# Patient Record
Sex: Male | Born: 1940 | Race: Black or African American | Hispanic: No | Marital: Married | Smoking: Former smoker
Health system: Southern US, Community
[De-identification: ages and names within clinical notes are randomized; demographics above are authoritative.]

## PROBLEM LIST (undated history)

## (undated) DIAGNOSIS — C189 Malignant neoplasm of colon, unspecified: Secondary | ICD-10-CM

## (undated) DIAGNOSIS — Z7952 Long term (current) use of systemic steroids: Secondary | ICD-10-CM

## (undated) DIAGNOSIS — C679 Malignant neoplasm of bladder, unspecified: Secondary | ICD-10-CM

## (undated) DIAGNOSIS — I10 Essential (primary) hypertension: Secondary | ICD-10-CM

## (undated) HISTORY — PX: BLADDER SURGERY: SHX569

## (undated) HISTORY — PX: CHOLECYSTECTOMY: SHX55

## (undated) HISTORY — PX: COLON SURGERY: SHX602

## (undated) HISTORY — PX: HERNIA REPAIR: SHX51

---

## 2001-01-24 ENCOUNTER — Emergency Department (HOSPITAL_COMMUNITY): Admission: EM | Admit: 2001-01-24 | Discharge: 2001-01-24 | Payer: Self-pay | Admitting: Emergency Medicine

## 2017-11-28 ENCOUNTER — Other Ambulatory Visit: Payer: Self-pay

## 2017-11-28 ENCOUNTER — Emergency Department (HOSPITAL_BASED_OUTPATIENT_CLINIC_OR_DEPARTMENT_OTHER): Payer: Medicare HMO

## 2017-11-28 ENCOUNTER — Emergency Department (HOSPITAL_BASED_OUTPATIENT_CLINIC_OR_DEPARTMENT_OTHER)
Admission: EM | Admit: 2017-11-28 | Discharge: 2017-11-28 | Disposition: A | Discharge status: Home / Self Care | Payer: Medicare HMO | Attending: Emergency Medicine | Admitting: Emergency Medicine

## 2017-11-28 ENCOUNTER — Encounter (HOSPITAL_BASED_OUTPATIENT_CLINIC_OR_DEPARTMENT_OTHER): Payer: Self-pay

## 2017-11-28 DIAGNOSIS — R197 Diarrhea, unspecified: Secondary | ICD-10-CM

## 2017-11-28 DIAGNOSIS — I10 Essential (primary) hypertension: Secondary | ICD-10-CM | POA: Insufficient documentation

## 2017-11-28 DIAGNOSIS — Z87891 Personal history of nicotine dependence: Secondary | ICD-10-CM | POA: Insufficient documentation

## 2017-11-28 DIAGNOSIS — Z85038 Personal history of other malignant neoplasm of large intestine: Secondary | ICD-10-CM | POA: Insufficient documentation

## 2017-11-28 DIAGNOSIS — Z7982 Long term (current) use of aspirin: Secondary | ICD-10-CM | POA: Insufficient documentation

## 2017-11-28 DIAGNOSIS — R1084 Generalized abdominal pain: Secondary | ICD-10-CM | POA: Diagnosis not present

## 2017-11-28 DIAGNOSIS — Z8551 Personal history of malignant neoplasm of bladder: Secondary | ICD-10-CM | POA: Insufficient documentation

## 2017-11-28 DIAGNOSIS — Z79899 Other long term (current) drug therapy: Secondary | ICD-10-CM | POA: Diagnosis not present

## 2017-11-28 HISTORY — DX: Malignant neoplasm of colon, unspecified: C18.9

## 2017-11-28 HISTORY — DX: Essential (primary) hypertension: I10

## 2017-11-28 HISTORY — DX: Malignant neoplasm of bladder, unspecified: C67.9

## 2017-11-28 LAB — COMPREHENSIVE METABOLIC PANEL
ALT: 11 U/L (ref 0–44)
AST: 23 U/L (ref 15–41)
Albumin: 4.8 g/dL (ref 3.5–5.0)
Alkaline Phosphatase: 63 U/L (ref 38–126)
Anion gap: 13 (ref 5–15)
BILIRUBIN TOTAL: 0.7 mg/dL (ref 0.3–1.2)
BUN: 17 mg/dL (ref 8–23)
CO2: 26 mmol/L (ref 22–32)
CREATININE: 1.77 mg/dL — AB (ref 0.61–1.24)
Calcium: 9.8 mg/dL (ref 8.9–10.3)
Chloride: 99 mmol/L (ref 98–111)
GFR calc Af Amer: 41 mL/min — ABNORMAL LOW (ref >60)
GFR, EST NON AFRICAN AMERICAN: 35 mL/min — AB (ref >60)
Glucose, Bld: 115 mg/dL — ABNORMAL HIGH (ref 70–99)
Potassium: 4 mmol/L (ref 3.5–5.1)
Sodium: 138 mmol/L (ref 135–145)
TOTAL PROTEIN: 8.2 g/dL — AB (ref 6.5–8.1)

## 2017-11-28 LAB — CBC WITH DIFFERENTIAL/PLATELET
BASOS ABS: 0 10*3/uL (ref 0.0–0.1)
Basophils Relative: 0 %
Eosinophils Absolute: 0.3 10*3/uL (ref 0.0–0.7)
Eosinophils Relative: 2 %
HEMATOCRIT: 43.7 % (ref 39.0–52.0)
Hemoglobin: 14.7 g/dL (ref 13.0–17.0)
Lymphocytes Relative: 9 %
Lymphs Abs: 1.3 10*3/uL (ref 0.7–4.0)
MCH: 29.3 pg (ref 26.0–34.0)
MCHC: 33.6 g/dL (ref 30.0–36.0)
MCV: 87.2 fL (ref 78.0–100.0)
Monocytes Absolute: 1.1 10*3/uL — ABNORMAL HIGH (ref 0.1–1.0)
Monocytes Relative: 8 %
NEUTROS ABS: 11.4 10*3/uL — AB (ref 1.7–7.7)
Neutrophils Relative %: 81 %
Platelets: 226 10*3/uL (ref 150–400)
RBC: 5.01 MIL/uL (ref 4.22–5.81)
RDW: 13.5 % (ref 11.5–15.5)
WBC: 14 10*3/uL — AB (ref 4.0–10.5)

## 2017-11-28 LAB — MAGNESIUM: MAGNESIUM: 1.9 mg/dL (ref 1.7–2.4)

## 2017-11-28 MED ORDER — SODIUM CHLORIDE 0.9 % IV BOLUS
1000.0000 mL | Freq: Once | INTRAVENOUS | Status: AC
  Administered 2017-11-28: 1000 mL via INTRAVENOUS

## 2017-11-28 MED ORDER — MORPHINE SULFATE (PF) 2 MG/ML IV SOLN
2.0000 mg | Freq: Once | INTRAVENOUS | Status: AC
  Administered 2017-11-28: 2 mg via INTRAVENOUS
  Filled 2017-11-28: qty 1

## 2017-11-28 MED ORDER — ONDANSETRON HCL 4 MG/2ML IJ SOLN
4.0000 mg | Freq: Once | INTRAMUSCULAR | Status: AC
  Administered 2017-11-28: 4 mg via INTRAVENOUS
  Filled 2017-11-28: qty 2

## 2017-11-28 MED ORDER — IOPAMIDOL (ISOVUE-300) INJECTION 61%
100.0000 mL | Freq: Once | INTRAVENOUS | Status: AC | PRN
  Administered 2017-11-28: 80 mL via INTRAVENOUS

## 2017-11-28 NOTE — ED Notes (Signed)
Patient transported to CT 

## 2017-11-28 NOTE — Progress Notes (Signed)
Placed patient on 3 liter nasal cannula due to patient's SPO2 dropping to 87%.  Patient's SPO2 increased and remains at 95%.

## 2017-11-28 NOTE — ED Triage Notes (Signed)
Pt n/v/d x 3 days-hx of diarrhea same but worse-to triage in w/c-NAD

## 2017-11-28 NOTE — Discharge Instructions (Signed)
You can use imodium at home for diarrhea.  Call your GI doc and your surgeon and see if that is a reasonable thing for you to try.

## 2017-11-28 NOTE — ED Provider Notes (Signed)
[facility] EMERGENCY DEPARTMENT Provider Note   CSN: [id number] Arrival date & time: 11/28/17  1703     History   Chief Complaint Chief Complaint  Patient presents with  . Diarrhea    HPI Justin Conway is a 77 y.o. male.  77 yo M with a chief complaints of diarrhea.  Going on for the past 3 to 4 days.  Denies fevers or chills.  Describes the diarrhea as dark.  Denies nausea or vomiting.  Diffuse crampy abdominal pain.  Was in a fishing competition all weekend and thinks maybe this started.  Denies any suspicious food or water intake.  The patient is also had a recent abdominal surgery where he had most of his small bowel removed about 3 weeks ago.  The history is provided by the patient.  Diarrhea   This is a new problem. The current episode started more than 2 days ago. The problem occurs 5 to 10 times per day. The problem has not changed since onset.There has been no fever. Associated symptoms include abdominal pain. Pertinent negatives include no vomiting, no chills, no headaches, no arthralgias and no myalgias. He has tried nothing for the symptoms. The treatment provided no relief.    Past Medical History:  Diagnosis Date  . Bladder cancer ([place])   . Colon cancer ([place])   . Hypertension     There are no active problems to display for this patient.   Past Surgical History:  Procedure Laterality Date  . BLADDER SURGERY    . CHOLECYSTECTOMY    . COLON SURGERY    . HERNIA REPAIR          Home Medications    Prior to Admission medications   Medication Sig Start Date End Date Taking? Authorizing Provider  amLODipine (NORVASC) 10 MG tablet Take 10 mg by mouth daily.   Yes [provider]  aspirin 81 MG chewable tablet Chew by mouth daily.   Yes [provider]  diphenhydrAMINE (BENADRYL) 25 MG tablet Take 25 mg by mouth every 6 (six) hours as needed.   Yes [provider]  DULoxetine (CYMBALTA) 20 MG capsule Take 20 mg by  mouth daily.   Yes [provider]  Glycopyrrolate (ROBINUL PO) Take by mouth.   Yes [provider]  hydrALAZINE (APRESOLINE) 25 MG tablet Take 25 mg by mouth 3 (three) times daily.   Yes [provider]  loperamide (IMODIUM) 2 MG capsule Take by mouth as needed for diarrhea or loose stools.   Yes [provider]  losartan (COZAAR) 100 MG tablet Take 100 mg by mouth daily.   Yes [provider]  meloxicam (MOBIC) 7.5 MG tablet Take 7.5 mg by mouth daily.   Yes [provider]  omeprazole (PRILOSEC) 40 MG capsule Take 40 mg by mouth daily.   Yes [provider]    Family History No family history on file.  Social History Social History   Tobacco Use  . Smoking status: Former Research scientist (life sciences)  . Smokeless tobacco: Never Used  Substance Use Topics  . Alcohol use: Yes    Comment: rare  . Drug use: Never     Allergies   Morphine and related; Penicillins; and Prednisone   Review of Systems Review of Systems  Constitutional: Negative for chills and fever.  HENT: Negative for congestion and facial swelling.   Eyes: Negative for discharge and visual disturbance.  Respiratory: Negative for shortness of breath.   Cardiovascular: Negative for  chest pain and palpitations.  Gastrointestinal: Positive for abdominal pain and diarrhea. Negative for nausea and vomiting.  Musculoskeletal: Negative for arthralgias and myalgias.  Skin: Negative for color change and rash.  Neurological: Negative for tremors, syncope and headaches.  Psychiatric/Behavioral: Negative for confusion and dysphoric mood.     Physical Exam Updated Vital Signs BP (!) 149/76 (BP Location: Left Arm)   Pulse 76   Temp 97.8 F (36.6 C) (Oral)   Resp 16   SpO2 90%   Physical Exam  Constitutional: He is oriented to person, place, and time. He appears well-developed and well-nourished.  HENT:  Head: Normocephalic and atraumatic.  Eyes: Pupils are equal, round,  and reactive to light. EOM are normal.  Neck: Normal range of motion. Neck supple. No JVD present.  Cardiovascular: Normal rate and regular rhythm. Exam reveals no gallop and no friction rub.  No murmur heard. Pulmonary/Chest: No respiratory distress. He has no wheezes.  Abdominal: He exhibits no distension and no mass. There is tenderness (worst to RUQ, + murphys). There is no rebound and no guarding.  Musculoskeletal: Normal range of motion.  Neurological: He is alert and oriented to person, place, and time.  Skin: No rash noted. No pallor.  Psychiatric: He has a normal mood and affect. His behavior is normal.  Nursing note and vitals reviewed.    ED Treatments / Results  Labs (all labs ordered are listed, but only abnormal results are displayed) Labs Reviewed  CBC WITH DIFFERENTIAL/PLATELET - Abnormal; Notable for the following components:      Result Value   WBC 14.0 (*)    Neutro Abs 11.4 (*)    Monocytes Absolute 1.1 (*)    All other components within normal limits  COMPREHENSIVE METABOLIC PANEL - Abnormal; Notable for the following components:   Glucose, Bld 115 (*)    Creatinine, Ser 1.77 (*)    Total Protein 8.2 (*)    GFR calc non Af Amer 35 (*)    GFR calc Af Amer 41 (*)    All other components within normal limits  MAGNESIUM    EKG None  Radiology Ct Abdomen Pelvis W Contrast  Result Date: 11/28/2017 CLINICAL DATA:  Left side abdominal pain, nausea, vomiting and diarrhea for 3 days. EXAM: CT ABDOMEN AND PELVIS WITH CONTRAST TECHNIQUE: Multidetector CT imaging of the abdomen and pelvis was performed using the standard protocol following bolus administration of intravenous contrast. CONTRAST:  80 mL ISOVUE-300 IOPAMIDOL (ISOVUE-300) INJECTION 61% COMPARISON:  None. FINDINGS: Lower chest: Mild dependent atelectasis is present in the lung bases. No pleural or pericardial effusion. Hepatobiliary: Status post cholecystectomy. Mild intra and extrahepatic biliary ductal  prominence is not notably changed and likely related to the patient's age and prior cholecystectomy. No focal liver lesion. Pancreas: Unremarkable. No pancreatic ductal dilatation or surrounding inflammatory changes. Spleen: Normal in size without focal abnormality. Adrenals/Urinary Tract: Cortical thinning and lobulation appear unchanged. Scattered, very small low attenuating lesions about the kidneys are likely cysts and also stable in appearance. Urinary bladder appears normal. Ureters are unremarkable. The adrenal glands appear normal. Stomach/Bowel: The patient is status post colectomy. Surgical anastomosis in the pelvis is intact and unchanged in appearance. Stomach and small bowel appear normal. Vascular/Lymphatic: Aortic atherosclerosis. No enlarged abdominal or pelvic lymph nodes. Reproductive: Status post hysterectomy. No adnexal masses. Other: Small fat containing midline supraumbilical hernias are unchanged. No ascites. Musculoskeletal: No acute or focal abnormality. Lumbar spondylosis noted. IMPRESSION: No acute abnormality abdomen or pelvis. Atherosclerosis. Small  fat containing supraumbilical hernias. 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 Electronically Signed   By: Inge Rise M.D.   On: 11/28/2017 19:43    Procedures Procedures (including critical care time)  Medications Ordered in ED Medications  sodium chloride 0.9 % bolus 1,000 mL (1,000 mLs Intravenous New Bag/Given 11/28/17 1817)  morphine 2 MG/ML injection 2 mg (2 mg Intravenous Given 11/28/17 1830)  ondansetron (ZOFRAN) injection 4 mg (4 mg Intravenous Given 11/28/17 1831)  iopamidol (ISOVUE-300) 61 % injection 100 mL (80 mLs Intravenous Contrast Given 11/28/17 1903)     Initial Impression / Assessment and Plan / ED Course  I have reviewed the triage vital signs and the nursing notes.  Pertinent labs & imaging results that were available during my care of the patient were reviewed by me and  considered in my medical decision making (see chart for details).     77 yo M with a chief complaint of diarrhea going on for the past 3 days.  Describes it as dark.  Having some diffuse abdominal pain seems to come and go.  Denies fevers or chills.  My exam the patient has abdominal tenderness in the right upper quadrant, will obtain a CT scan.  Labs fluids pain and nausea medicine and reassess.  Hemoglobin is normal at 14.7.  CMP with mild worsening of his creatinine to 1.77.  Magnesium 1.9.  CT scan with no concerning finding.  Reassessed the patient is feeling better after IV fluids.  No continued bowel movements after his dose of morphine here.  We will have him discuss with his GI doctor and his surgeon.    8:58 PM:  I have discussed the diagnosis/risks/treatment options with the patient and family and believe the pt to be eligible for discharge home to follow-up with PCP, GI, gen surgery. We also discussed returning to the ED immediately if new or worsening sx occur. We discussed the sx which are most concerning (e.g., sudden worsening pain, fever, inability to tolerate by mouth) that necessitate immediate return. Medications administered to the patient during their visit and any new prescriptions provided to the patient are listed below.  Medications given during this visit Medications  sodium chloride 0.9 % bolus 1,000 mL (1,000 mLs Intravenous New Bag/Given 11/28/17 1817)  morphine 2 MG/ML injection 2 mg (2 mg Intravenous Given 11/28/17 1830)  ondansetron (ZOFRAN) injection 4 mg (4 mg Intravenous Given 11/28/17 1831)  iopamidol (ISOVUE-300) 61 % injection 100 mL (80 mLs Intravenous Contrast Given 11/28/17 1903)     The patient appears reasonably screen and/or stabilized for discharge and I doubt any other medical condition or other [facility] requiring further screening, evaluation, or treatment in the ED at this time prior to discharge.    Final Clinical Impressions(s) / ED Diagnoses   Final  diagnoses:  Diarrhea, unspecified type    ED Discharge Orders    None       Deno Etienne, DO 11/28/17 2058

## 2017-11-30 ENCOUNTER — Emergency Department (HOSPITAL_BASED_OUTPATIENT_CLINIC_OR_DEPARTMENT_OTHER): Payer: Medicare HMO

## 2017-11-30 ENCOUNTER — Encounter (HOSPITAL_BASED_OUTPATIENT_CLINIC_OR_DEPARTMENT_OTHER): Payer: Self-pay

## 2017-11-30 ENCOUNTER — Emergency Department (HOSPITAL_BASED_OUTPATIENT_CLINIC_OR_DEPARTMENT_OTHER)
Admission: EM | Admit: 2017-11-30 | Discharge: 2017-11-30 | Disposition: A | Discharge status: Other Acute Inpatient Hospital | Payer: Medicare HMO | Attending: Emergency Medicine | Admitting: Emergency Medicine

## 2017-11-30 DIAGNOSIS — Z79899 Other long term (current) drug therapy: Secondary | ICD-10-CM | POA: Insufficient documentation

## 2017-11-30 DIAGNOSIS — R252 Cramp and spasm: Secondary | ICD-10-CM | POA: Insufficient documentation

## 2017-11-30 DIAGNOSIS — R1084 Generalized abdominal pain: Secondary | ICD-10-CM | POA: Diagnosis not present

## 2017-11-30 DIAGNOSIS — Z87891 Personal history of nicotine dependence: Secondary | ICD-10-CM | POA: Insufficient documentation

## 2017-11-30 DIAGNOSIS — R112 Nausea with vomiting, unspecified: Secondary | ICD-10-CM | POA: Diagnosis not present

## 2017-11-30 DIAGNOSIS — Z8551 Personal history of malignant neoplasm of bladder: Secondary | ICD-10-CM | POA: Diagnosis not present

## 2017-11-30 DIAGNOSIS — Z7982 Long term (current) use of aspirin: Secondary | ICD-10-CM | POA: Diagnosis not present

## 2017-11-30 DIAGNOSIS — E860 Dehydration: Secondary | ICD-10-CM | POA: Diagnosis not present

## 2017-11-30 DIAGNOSIS — R197 Diarrhea, unspecified: Secondary | ICD-10-CM | POA: Diagnosis present

## 2017-11-30 DIAGNOSIS — I10 Essential (primary) hypertension: Secondary | ICD-10-CM | POA: Diagnosis not present

## 2017-11-30 DIAGNOSIS — Z85038 Personal history of other malignant neoplasm of large intestine: Secondary | ICD-10-CM | POA: Diagnosis not present

## 2017-11-30 DIAGNOSIS — N179 Acute kidney failure, unspecified: Secondary | ICD-10-CM | POA: Diagnosis not present

## 2017-11-30 LAB — COMPREHENSIVE METABOLIC PANEL
ALT: 13 U/L (ref 0–44)
ANION GAP: 17 — AB (ref 5–15)
AST: 27 U/L (ref 15–41)
Albumin: 4.8 g/dL (ref 3.5–5.0)
Alkaline Phosphatase: 64 U/L (ref 38–126)
BILIRUBIN TOTAL: 0.7 mg/dL (ref 0.3–1.2)
BUN: 27 mg/dL — ABNORMAL HIGH (ref 8–23)
CALCIUM: 10.1 mg/dL (ref 8.9–10.3)
CO2: 22 mmol/L (ref 22–32)
Chloride: 96 mmol/L — ABNORMAL LOW (ref 98–111)
Creatinine, Ser: 2.76 mg/dL — ABNORMAL HIGH (ref 0.61–1.24)
GFR, EST AFRICAN AMERICAN: 24 mL/min — AB (ref >60)
GFR, EST NON AFRICAN AMERICAN: 20 mL/min — AB (ref >60)
Glucose, Bld: 151 mg/dL — ABNORMAL HIGH (ref 70–99)
Potassium: 3.7 mmol/L (ref 3.5–5.1)
Sodium: 135 mmol/L (ref 135–145)
TOTAL PROTEIN: 8.4 g/dL — AB (ref 6.5–8.1)

## 2017-11-30 LAB — CBC
HCT: 41.4 % (ref 39.0–52.0)
HEMOGLOBIN: 14.7 g/dL (ref 13.0–17.0)
MCH: 30.2 pg (ref 26.0–34.0)
MCHC: 35.5 g/dL (ref 30.0–36.0)
MCV: 85 fL (ref 78.0–100.0)
Platelets: 279 10*3/uL (ref 150–400)
RBC: 4.87 MIL/uL (ref 4.22–5.81)
RDW: 13 % (ref 11.5–15.5)
WBC: 20 10*3/uL — ABNORMAL HIGH (ref 4.0–10.5)

## 2017-11-30 LAB — LIPASE, BLOOD: Lipase: 31 U/L (ref 11–51)

## 2017-11-30 LAB — I-STAT CG4 LACTIC ACID, ED: Lactic Acid, Venous: 3.95 mmol/L (ref 0.5–1.9)

## 2017-11-30 LAB — C DIFFICILE QUICK SCREEN W PCR REFLEX
C DIFFICILE (CDIFF) TOXIN: NEGATIVE
C DIFFICLE (CDIFF) ANTIGEN: NEGATIVE
C Diff interpretation: NOT DETECTED

## 2017-11-30 LAB — MAGNESIUM: MAGNESIUM: 1.7 mg/dL (ref 1.7–2.4)

## 2017-11-30 MED ORDER — FENTANYL CITRATE (PF) 100 MCG/2ML IJ SOLN
50.0000 ug | Freq: Once | INTRAMUSCULAR | Status: AC
  Administered 2017-11-30: 50 ug via INTRAVENOUS
  Filled 2017-11-30: qty 2

## 2017-11-30 MED ORDER — SODIUM CHLORIDE 0.9 % IV SOLN
2.0000 g | Freq: Once | INTRAVENOUS | Status: DC
  Filled 2017-11-30: qty 2

## 2017-11-30 MED ORDER — MORPHINE SULFATE (PF) 4 MG/ML IV SOLN
4.0000 mg | Freq: Once | INTRAVENOUS | Status: AC
  Administered 2017-11-30: 4 mg via INTRAVENOUS
  Filled 2017-11-30: qty 1

## 2017-11-30 MED ORDER — METRONIDAZOLE IN NACL 5-0.79 MG/ML-% IV SOLN
500.0000 mg | Freq: Three times a day (TID) | INTRAVENOUS | Status: DC
  Administered 2017-11-30: 500 mg via INTRAVENOUS
  Filled 2017-11-30: qty 100

## 2017-11-30 MED ORDER — VANCOMYCIN HCL IN DEXTROSE 1-5 GM/200ML-% IV SOLN: 1000.0000 mg | Freq: Once | INTRAVENOUS | Status: DC

## 2017-11-30 MED ORDER — SODIUM CHLORIDE 0.9 % IV SOLN
1.0000 g | Freq: Three times a day (TID) | INTRAVENOUS | Status: DC
  Administered 2017-11-30: 1 g via INTRAVENOUS

## 2017-11-30 MED ORDER — AZTREONAM 1 G IJ SOLR
INTRAMUSCULAR | Status: AC
  Filled 2017-11-30: qty 1

## 2017-11-30 MED ORDER — SODIUM CHLORIDE 0.9 % IV BOLUS
1000.0000 mL | Freq: Once | INTRAVENOUS | Status: AC
  Administered 2017-11-30: 1000 mL via INTRAVENOUS

## 2017-11-30 MED ORDER — VANCOMYCIN HCL 500 MG IV SOLR
INTRAVENOUS | Status: AC
  Filled 2017-11-30: qty 500

## 2017-11-30 MED ORDER — SODIUM CHLORIDE 0.9 % IV BOLUS
500.0000 mL | Freq: Once | INTRAVENOUS | Status: AC
  Administered 2017-11-30: 500 mL via INTRAVENOUS

## 2017-11-30 MED ORDER — ONDANSETRON HCL 4 MG/2ML IJ SOLN
4.0000 mg | Freq: Once | INTRAMUSCULAR | Status: AC
  Administered 2017-11-30: 4 mg via INTRAVENOUS
  Filled 2017-11-30: qty 2

## 2017-11-30 MED ORDER — VANCOMYCIN HCL IN DEXTROSE 1-5 GM/200ML-% IV SOLN: 1000.0000 mg | INTRAVENOUS | Status: DC

## 2017-11-30 MED ORDER — VANCOMYCIN HCL IN DEXTROSE 1-5 GM/200ML-% IV SOLN
INTRAVENOUS | Status: AC
  Filled 2017-11-30: qty 200

## 2017-11-30 MED ORDER — VANCOMYCIN HCL 10 G IV SOLR
1500.0000 mg | Freq: Once | INTRAVENOUS | Status: AC
  Administered 2017-11-30: 1500 mg via INTRAVENOUS
  Filled 2017-11-30: qty 1500

## 2017-11-30 MED ORDER — FENTANYL CITRATE (PF) 100 MCG/2ML IJ SOLN: 50.0000 ug | Freq: Once | INTRAMUSCULAR | Status: DC

## 2017-11-30 NOTE — ED Notes (Signed)
Desat to 83% on r/a.  Placed on 2l/m [place].

## 2017-11-30 NOTE — ED Notes (Signed)
Report given to [facility] with [facility] transport.

## 2017-11-30 NOTE — ED Triage Notes (Signed)
Pt c/o diarrhea, cramping all over; pt became diaphoretic and fell getting out of the car coming him. Pt states seen here a few days ago for same, states pain is worse

## 2017-11-30 NOTE — ED Notes (Signed)
Report given to [facility] on 6S.

## 2017-11-30 NOTE — ED Notes (Signed)
Called [facility] for [facility] hospitalist

## 2017-11-30 NOTE — ED Notes (Signed)
Call pt son Merry Proud [phone] or wife, Mariann Laster [phone] when pt gets room at [facility].

## 2017-11-30 NOTE — ED Notes (Signed)
Pt in NAD, RR even and unlabored. Call bell within reach. Spoke with Fritz Pickerel with [employer]. Pt to go to Rm [phone] at [facility].

## 2017-11-30 NOTE — Progress Notes (Signed)
Pharmacy Antibiotic Note  Justin Conway is a 77 y.o. male admitted on 11/30/2017 with sepsis.  Pharmacy has been consulted for vancomycin and aztreonam dosing.  Hx of C. Difficile, afebrile, WBC 22.  SCr 2.76 and CrCl ~ 24 mL/min  Plan: Vancomycin 1500mg  IV x1, then 1000mg  IV every 24 hours Aztreonam 1g IV every 8 hours Monitor renal function, vancomycin levels as appropriate, clinical progression and LOT     Temp (24hrs), Avg:97.7 F (36.5 C), Min:97.7 F (36.5 C), Max:97.7 F (36.5 C)  Recent Labs  Lab 11/28/17 1814 11/30/17 1035 11/30/17 1500  WBC 14.0* 20.0*  --   CREATININE 1.77* 2.76*  --   LATICACIDVEN  --   --  3.95*    CrCl cannot be calculated (Unknown ideal weight.).    Allergies  Allergen Reactions  . Morphine And Related Other (See Comments)    hallucinfations  . Penicillins   . Prednisone     Antimicrobials this admission: Vanc 8/22>> Aztreonam 8/22>>  Dose adjustments this admission: n/a  Microbiology results: BCx 8/22: sent C Dif PCR: sent  Bertis Ruddy, PharmD Clinical Pharmacist Please check AMION for all [place] numbers 11/30/2017 3:11 PM

## 2017-11-30 NOTE — ED Provider Notes (Signed)
[facility] EMERGENCY DEPARTMENT Provider Note   CSN: [id number] Arrival date & time: 11/30/17  1023     History   Chief Complaint Chief Complaint  Patient presents with  . Diarrhea    HPI Justin Conway is a 77 y.o. male.  Patient is a 77 year old male who presents with abdominal pain and diarrhea.  He has a history of prior colon cancer and had a colectomy done in 2005.  He has had about a 5-day history of watery diarrhea.  He denies any blood in his stool.  He has had some intermittent abdominal cramping.  He was seen here 2 days ago and had labs and a CT scan which did not show any significant findings.  He states he was getting a little bit better but yesterday started getting worse again.  He started having severe muscle cramping as well.  No known fevers.  He has started to have some vomiting again today.  He does have a prior history of C. difficile.  Denies any recent antibiotic usage.     Past Medical History:  Diagnosis Date  . Bladder cancer ([place])   . Colon cancer ([place])   . Hypertension     There are no active problems to display for this patient.   Past Surgical History:  Procedure Laterality Date  . BLADDER SURGERY    . CHOLECYSTECTOMY    . COLON SURGERY    . HERNIA REPAIR          Home Medications    Prior to Admission medications   Medication Sig Start Date End Date Taking? Authorizing Provider  amLODipine (NORVASC) 10 MG tablet Take 10 mg by mouth daily.    [provider]  aspirin 81 MG chewable tablet Chew by mouth daily.    [provider]  diphenhydrAMINE (BENADRYL) 25 MG tablet Take 25 mg by mouth every 6 (six) hours as needed.    [provider]  DULoxetine (CYMBALTA) 20 MG capsule Take 20 mg by mouth daily.    [provider]  Glycopyrrolate (ROBINUL PO) Take by mouth.    [provider]  hydrALAZINE (APRESOLINE) 25 MG tablet Take 25 mg by mouth 3 (three) times daily.    [provider]  loperamide (IMODIUM) 2 MG capsule Take by mouth as needed for diarrhea or loose stools.    [provider]  losartan (COZAAR) 100 MG tablet Take 100 mg by mouth daily.    [provider]  meloxicam (MOBIC) 7.5 MG tablet Take 7.5 mg by mouth daily.    [provider]  omeprazole (PRILOSEC) 40 MG capsule Take 40 mg by mouth daily.    [provider]    Family History No family history on file.  Social History Social History   Tobacco Use  . Smoking status: Former Research scientist (life sciences)  . Smokeless tobacco: Never Used  Substance Use Topics  . Alcohol use: Yes    Comment: rare  . Drug use: Never     Allergies   Morphine and related; Penicillins; and Prednisone   Review of Systems Review of Systems  Constitutional: Negative for chills, diaphoresis, fatigue and fever.  HENT: Negative for congestion, rhinorrhea and sneezing.   Eyes: Negative.   Respiratory: Negative for cough, chest tightness and shortness of breath.   Cardiovascular: Negative for chest pain and leg swelling.  Gastrointestinal: Positive for abdominal pain, diarrhea, nausea and vomiting. Negative for blood in stool.  Genitourinary: Negative for difficulty urinating,  flank pain, frequency and hematuria.  Musculoskeletal: Positive for myalgias. Negative for arthralgias and back pain.  Skin: Negative for rash.  Neurological: Negative for dizziness, speech difficulty, weakness, numbness and headaches.     Physical Exam Updated Vital Signs BP (!) 94/51 (BP Location: Right Arm)   Pulse 72   Temp 97.7 F (36.5 C) (Oral)   Resp 18   SpO2 95%   Physical Exam  Constitutional: He is oriented to person, place, and time. He appears well-developed and well-nourished.  HENT:  Head: Normocephalic and atraumatic.  Eyes: Pupils are equal, round, and reactive to light.  Neck: Normal range of motion. Neck supple.  Cardiovascular: Normal rate, regular rhythm and normal heart sounds.    Pulmonary/Chest: Effort normal and breath sounds normal. No respiratory distress. He has no wheezes. He has no rales. He exhibits no tenderness.  Abdominal: Soft. Bowel sounds are normal. There is tenderness (mild diffuse tenderness). There is no rebound and no guarding.  Musculoskeletal: Normal range of motion. He exhibits no edema.  Lymphadenopathy:    He has no cervical adenopathy.  Neurological: He is alert and oriented to person, place, and time.  Skin: Skin is warm and dry. No rash noted.  Psychiatric: He has a normal mood and affect.     ED Treatments / Results  Labs (all labs ordered are listed, but only abnormal results are displayed) Labs Reviewed  COMPREHENSIVE METABOLIC PANEL - Abnormal; Notable for the following components:      Result Value   Chloride 96 (*)    Glucose, Bld 151 (*)    BUN 27 (*)    Creatinine, Ser 2.76 (*)    Total Protein 8.4 (*)    GFR calc non Af Amer 20 (*)    GFR calc Af Amer 24 (*)    Anion gap 17 (*)    All other components within normal limits  CBC - Abnormal; Notable for the following components:   WBC 20.0 (*)    All other components within normal limits  I-STAT CG4 LACTIC ACID, ED - Abnormal; Notable for the following components:   Lactic Acid, Venous 3.95 (*)    All other components within normal limits  GASTROINTESTINAL PANEL BY PCR, STOOL (REPLACES STOOL CULTURE)  C DIFFICILE QUICK SCREEN W PCR REFLEX  CULTURE, BLOOD (ROUTINE X 2)  CULTURE, BLOOD (ROUTINE X 2)  LIPASE, BLOOD  MAGNESIUM  URINALYSIS, ROUTINE W REFLEX MICROSCOPIC    EKG EKG Interpretation  Date/Time:  Thursday November 30 2017 10:31:35 EDT Ventricular Rate:  69 PR Interval:    QRS Duration: 103 QT Interval:  409 QTC Calculation: 439 R Axis:   51 Text Interpretation:  Sinus rhythm Baseline wander in lead(s) V3 No old tracing to compare Confirmed by Malvin Johns [phone]) on 11/30/2017 10:47:51 AM   Radiology Ct Abdomen Pelvis Wo Contrast  Result Date:  11/30/2017 CLINICAL DATA:  Abdominal pain, gastroenteritis EXAM: CT ABDOMEN AND PELVIS WITHOUT CONTRAST TECHNIQUE: Multidetector CT imaging of the abdomen and pelvis was performed following the standard protocol without IV contrast. COMPARISON:  11/28/2017 FINDINGS: Lower chest: No acute abnormality. Hepatobiliary: No focal liver abnormality is seen. Status post cholecystectomy. No biliary dilatation. Pancreas: Unremarkable. No pancreatic ductal dilatation or surrounding inflammatory changes. Spleen: Normal in size without focal abnormality. Adrenals/Urinary Tract: Adrenal glands are unremarkable. Small 10 mm left renal cyst again noted. Kidneys are otherwise normal, without renal calculi, solid focal lesion, or hydronephrosis. Bladder wall thickening likely secondary to underdistention. Stomach/Bowel: Stomach is  within normal limits. Prior colectomy. No evidence of bowel wall thickening, distention, or inflammatory changes. Vascular/Lymphatic: Normal caliber abdominal aorta with atherosclerosis. Reproductive: Prostate is unremarkable. Other: No fluid collection or hematoma. No ascites. Right and left paramedian ventral fat containing hernia. Small fat containing umbilical hernia. Musculoskeletal: No acute osseous abnormality. No aggressive osseous lesion. 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 IMPRESSION: 1. No acute abdominal or pelvic pathology. Electronically Signed   By: Kathreen Devoid   On: 11/30/2017 14:19   Ct Abdomen Pelvis W Contrast  Result Date: 11/28/2017 CLINICAL DATA:  Left side abdominal pain, nausea, vomiting and diarrhea for 3 days. EXAM: CT ABDOMEN AND PELVIS WITH CONTRAST TECHNIQUE: Multidetector CT imaging of the abdomen and pelvis was performed using the standard protocol following bolus administration of intravenous contrast. CONTRAST:  80 mL ISOVUE-300 IOPAMIDOL (ISOVUE-300) INJECTION 61% COMPARISON:  None. FINDINGS: Lower chest:  Mild dependent atelectasis is present in the lung bases. No pleural or pericardial effusion. Hepatobiliary: Status post cholecystectomy. Mild intra and extrahepatic biliary ductal prominence is not notably changed and likely related to the patient's age and prior cholecystectomy. No focal liver lesion. Pancreas: Unremarkable. No pancreatic ductal dilatation or surrounding inflammatory changes. Spleen: Normal in size without focal abnormality. Adrenals/Urinary Tract: Cortical thinning and lobulation appear unchanged. Scattered, very small low attenuating lesions about the kidneys are likely cysts and also stable in appearance. Urinary bladder appears normal. Ureters are unremarkable. The adrenal glands appear normal. Stomach/Bowel: The patient is status post colectomy. Surgical anastomosis in the pelvis is intact and unchanged in appearance. Stomach and small bowel appear normal. Vascular/Lymphatic: Aortic atherosclerosis. No enlarged abdominal or pelvic lymph nodes. Reproductive: Status post hysterectomy. No adnexal masses. Other: Small fat containing midline supraumbilical hernias are unchanged. No ascites. Musculoskeletal: No acute or focal abnormality. Lumbar spondylosis noted. IMPRESSION: No acute abnormality abdomen or pelvis. Atherosclerosis. Small fat containing supraumbilical hernias. 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 Electronically Signed   By: Inge Rise M.D.   On: 11/28/2017 19:43    Procedures Procedures (including critical care time)  Medications Ordered in ED Medications  sodium chloride 0.9 % bolus 500 mL (500 mLs Intravenous New Bag/Given 11/30/17 1453)  aztreonam (AZACTAM) 2 g in sodium chloride 0.9 % 100 mL IVPB (has no administration in time range)  metroNIDAZOLE (FLAGYL) IVPB 500 mg (has no administration in time range)  vancomycin (VANCOCIN) IVPB 1000 mg/200 mL premix (has no administration in time range)  sodium chloride 0.9 % bolus 1,000 mL (0  mLs Intravenous Stopped 11/30/17 1251)  fentaNYL (SUBLIMAZE) injection 50 mcg (50 mcg Intravenous Given 11/30/17 1144)  ondansetron (ZOFRAN) injection 4 mg (4 mg Intravenous Given 11/30/17 1143)  morphine 4 MG/ML injection 4 mg (4 mg Intravenous Given 11/30/17 1222)  fentaNYL (SUBLIMAZE) injection 50 mcg (50 mcg Intravenous Given 11/30/17 1442)     Initial Impression / Assessment and Plan / ED Course  I have reviewed the triage vital signs and the nursing notes.  Pertinent labs & imaging results that were available during my care of the patient were reviewed by me and considered in my medical decision making (see chart for details).     Patient is a 77 year old male who presents with ongoing diarrhea.  He has some intermittent abdominal pain but no significant abdominal pain on exam.  He has had some vomiting.  No known fevers.  He does have some diffuse muscle cramping.  Initially his symptoms sound like  colitis and still may be related to colitis.  He has a history of C. difficile.  He had a CT scan order which took a bit of time to get the results from.  This shows no acute abdominal abnormality.  I then did check a lactate which was elevated.  He was given antibiotics for possible sepsis.  His white count is markedly elevated as compared to his recent values.  He has a worsening acute kidney injury.  He does have intermittent muscle cramps but his electrodes look okay.  His magnesium level is normal.  His urinalysis is still pending.  Stool samples were obtained for testing.  Do not see any other source of infection.  Chest x-ray is also pending.  I did speak with Dr. Dwyane Dee with [facility] who is excepted the patient for transfer.  Patient requests admission to [facility] hospital.  Final Clinical Impressions(s) / ED Diagnoses   Final diagnoses:  Diarrhea, unspecified type  AKI (acute kidney injury) ([place])  Dehydration  Muscle cramps    ED Discharge Orders    None         Malvin Johns, MD 11/30/17 1506

## 2017-12-01 LAB — GASTROINTESTINAL PANEL BY PCR, STOOL (REPLACES STOOL CULTURE)

## 2017-12-05 LAB — CULTURE, BLOOD (ROUTINE X 2)
CULTURE: NO GROWTH
Culture: NO GROWTH
SPECIAL REQUESTS: ADEQUATE
Special Requests: ADEQUATE

## 2019-01-18 ENCOUNTER — Emergency Department (HOSPITAL_BASED_OUTPATIENT_CLINIC_OR_DEPARTMENT_OTHER): Payer: Medicare HMO

## 2019-01-18 ENCOUNTER — Encounter (HOSPITAL_BASED_OUTPATIENT_CLINIC_OR_DEPARTMENT_OTHER): Payer: Self-pay

## 2019-01-18 ENCOUNTER — Emergency Department (HOSPITAL_BASED_OUTPATIENT_CLINIC_OR_DEPARTMENT_OTHER)
Admission: EM | Admit: 2019-01-18 | Discharge: 2019-01-18 | Disposition: A | Discharge status: Home / Self Care | Payer: Medicare HMO | Attending: Emergency Medicine | Admitting: Emergency Medicine

## 2019-01-18 ENCOUNTER — Other Ambulatory Visit: Payer: Self-pay

## 2019-01-18 DIAGNOSIS — R197 Diarrhea, unspecified: Secondary | ICD-10-CM | POA: Insufficient documentation

## 2019-01-18 DIAGNOSIS — I10 Essential (primary) hypertension: Secondary | ICD-10-CM | POA: Insufficient documentation

## 2019-01-18 DIAGNOSIS — Z87891 Personal history of nicotine dependence: Secondary | ICD-10-CM | POA: Diagnosis not present

## 2019-01-18 DIAGNOSIS — Z85038 Personal history of other malignant neoplasm of large intestine: Secondary | ICD-10-CM | POA: Diagnosis not present

## 2019-01-18 DIAGNOSIS — Z8551 Personal history of malignant neoplasm of bladder: Secondary | ICD-10-CM | POA: Diagnosis not present

## 2019-01-18 DIAGNOSIS — R0602 Shortness of breath: Secondary | ICD-10-CM | POA: Diagnosis not present

## 2019-01-18 DIAGNOSIS — R0789 Other chest pain: Secondary | ICD-10-CM | POA: Diagnosis not present

## 2019-01-18 DIAGNOSIS — Z20828 Contact with and (suspected) exposure to other viral communicable diseases: Secondary | ICD-10-CM | POA: Diagnosis not present

## 2019-01-18 DIAGNOSIS — R6889 Other general symptoms and signs: Secondary | ICD-10-CM

## 2019-01-18 DIAGNOSIS — R079 Chest pain, unspecified: Secondary | ICD-10-CM

## 2019-01-18 DIAGNOSIS — R1084 Generalized abdominal pain: Secondary | ICD-10-CM | POA: Diagnosis present

## 2019-01-18 LAB — COMPREHENSIVE METABOLIC PANEL
ALT: 15 U/L (ref 0–44)
AST: 22 U/L (ref 15–41)
Albumin: 4 g/dL (ref 3.5–5.0)
Alkaline Phosphatase: 56 U/L (ref 38–126)
Anion gap: 9 (ref 5–15)
BUN: 18 mg/dL (ref 8–23)
CO2: 24 mmol/L (ref 22–32)
Calcium: 9.3 mg/dL (ref 8.9–10.3)
Chloride: 105 mmol/L (ref 98–111)
Creatinine, Ser: 1.24 mg/dL (ref 0.61–1.24)
GFR calc Af Amer: >60 mL/min (ref >60)
GFR calc non Af Amer: 55 mL/min — ABNORMAL LOW (ref >60)
Glucose, Bld: 112 mg/dL — ABNORMAL HIGH (ref 70–99)
Potassium: 4 mmol/L (ref 3.5–5.1)
Sodium: 138 mmol/L (ref 135–145)
Total Bilirubin: 0.3 mg/dL (ref 0.3–1.2)
Total Protein: 6.8 g/dL (ref 6.5–8.1)

## 2019-01-18 LAB — CBC
HCT: 40.2 % (ref 39.0–52.0)
Hemoglobin: 12.9 g/dL — ABNORMAL LOW (ref 13.0–17.0)
MCH: 29.3 pg (ref 26.0–34.0)
MCHC: 32.1 g/dL (ref 30.0–36.0)
MCV: 91.4 fL (ref 80.0–100.0)
Platelets: 218 10*3/uL (ref 150–400)
RBC: 4.4 MIL/uL (ref 4.22–5.81)
RDW: 12.7 % (ref 11.5–15.5)
WBC: 8 10*3/uL (ref 4.0–10.5)
nRBC: 0 % (ref 0.0–0.2)

## 2019-01-18 LAB — URINALYSIS, ROUTINE W REFLEX MICROSCOPIC
Bilirubin Urine: NEGATIVE
Glucose, UA: NEGATIVE mg/dL
Hgb urine dipstick: NEGATIVE
Ketones, ur: NEGATIVE mg/dL
Nitrite: NEGATIVE
Protein, ur: NEGATIVE mg/dL
Specific Gravity, Urine: <1.005 — ABNORMAL LOW (ref 1.005–1.030)
pH: 6.5 (ref 5.0–8.0)

## 2019-01-18 LAB — URINALYSIS, MICROSCOPIC (REFLEX): RBC / HPF: NONE SEEN RBC/hpf (ref 0–5)

## 2019-01-18 LAB — LIPASE, BLOOD: Lipase: 25 U/L (ref 11–51)

## 2019-01-18 LAB — TROPONIN I (HIGH SENSITIVITY): Troponin I (High Sensitivity): 5 ng/L (ref <18)

## 2019-01-18 MED ORDER — SODIUM CHLORIDE 0.9% FLUSH
3.0000 mL | Freq: Once | INTRAVENOUS | Status: DC
  Filled 2019-01-18: qty 3

## 2019-01-18 MED ORDER — LOPERAMIDE HCL 2 MG PO CAPS: 2.0000 mg | ORAL_CAPSULE | Freq: Four times a day (QID) | ORAL | 0 refills | Status: DC | PRN

## 2019-01-18 MED ORDER — SODIUM CHLORIDE 0.9 % IV BOLUS
1000.0000 mL | Freq: Once | INTRAVENOUS | Status: AC
  Administered 2019-01-18: 1000 mL via INTRAVENOUS

## 2019-01-18 NOTE — ED Notes (Signed)
Pt unable to give urine sample at this time 

## 2019-01-18 NOTE — ED Provider Notes (Signed)
Emergency Department Provider Note   I have reviewed the triage vital signs and the nursing notes.   HISTORY  Chief Complaint Abdominal Pain   HPI Justin Conway is a 78 y.o. male with PMH reviewed below presents to the ED with abdominal cramping, CP, muscle aches and mild SOB. Symptoms worsening over the last week. He notes a remote COVID contact but nothing in the last 2 weeks. No blood in the or stool. No fever. Multiple episodes of diarrhea in the last week. No travel or recent abx. CP is mild and appears to be in the lower chest/upper abdomen without radiation.     Past Medical History:  Diagnosis Date  . Bladder cancer ([place])   . Colon cancer ([place])   . Hypertension     There are no active problems to display for this patient.   Past Surgical History:  Procedure Laterality Date  . BLADDER SURGERY    . CHOLECYSTECTOMY    . COLON SURGERY    . HERNIA REPAIR      Allergies Morphine and related, Penicillins, and Prednisone  No family history on file.  Social History Social History   Tobacco Use  . Smoking status: Former Research scientist (life sciences)  . Smokeless tobacco: Never Used  Substance Use Topics  . Alcohol use: Not Currently  . Drug use: Never    Review of Systems  Constitutional: No fever/chills. Positive muscle aches.  Eyes: No visual changes. ENT: No sore throat. Cardiovascular: Positive chest pain. Respiratory: Positive shortness of breath. Gastrointestinal: Positive epigastric abdominal pain.  Positive nausea, no vomiting. Positive diarrhea.  No constipation. Genitourinary: Negative for dysuria. Musculoskeletal: Negative for back pain. Skin: Negative for rash. Neurological: Negative for headaches, focal weakness or numbness.  10-point ROS otherwise negative.  ____________________________________________   PHYSICAL EXAM:  VITAL SIGNS: ED Triage Vitals  Enc Vitals Group     BP 01/18/19 1648 (!) 155/71     Pulse Rate 01/18/19 1648 73     Resp 01/18/19  1648 20     Temp 01/18/19 1915 97.8 F (36.6 C)     Temp Source 01/18/19 1648 Oral     SpO2 01/18/19 1648 98 %     Weight 01/18/19 1647 173 lb (78.5 kg)     Height 01/18/19 1647 5\' 11"  (1.803 m)   Constitutional: Alert and oriented. Well appearing and in no acute distress. Eyes: Conjunctivae are normal. Head: Atraumatic. Nose: No congestion/rhinnorhea. Mouth/Throat: Mucous membranes are moist.  Neck: No stridor.  Cardiovascular: Normal rate, regular rhythm. Good peripheral circulation. Grossly normal heart sounds.   Respiratory: Normal respiratory effort.  No retractions. Lungs CTAB. Gastrointestinal: Soft and nontender. No distention.  Musculoskeletal: No lower extremity tenderness nor edema. No gross deformities of extremities. Neurologic:  Normal speech and language. No gross focal neurologic deficits are appreciated.  Skin:  Skin is warm, dry and intact. No rash noted.  ____________________________________________   LABS (all labs ordered are listed, but only abnormal results are displayed)  Labs Reviewed  COMPREHENSIVE METABOLIC PANEL - Abnormal; Notable for the following components:      Result Value   Glucose, Bld 112 (*)    GFR calc non Af Amer 55 (*)    All other components within normal limits  CBC - Abnormal; Notable for the following components:   Hemoglobin 12.9 (*)    All other components within normal limits  URINALYSIS, ROUTINE W REFLEX MICROSCOPIC - Abnormal; Notable for the following components:   Specific Gravity, Urine <1.005 (*)  Leukocytes,Ua TRACE (*)    All other components within normal limits  URINALYSIS, MICROSCOPIC (REFLEX) - Abnormal; Notable for the following components:   Bacteria, UA RARE (*)    All other components within normal limits  SARS CORONAVIRUS 2 (TAT 6-24 HRS)  LIPASE, BLOOD  TROPONIN I (HIGH SENSITIVITY)   ____________________________________________  EKG   EKG Interpretation  Date/Time:  Friday January 18 2019 16:53:30  EDT Ventricular Rate:  68 PR Interval:  184 QRS Duration: 96 QT Interval:  398 QTC Calculation: 423 R Axis:   63 Text Interpretation:  Normal sinus rhythm Incomplete right bundle branch block Borderline ECG No STEMI  Confirmed by Nanda Quinton [phone]) on 01/18/2019 5:09:28 PM       ____________________________________________  RADIOLOGY  No results found.  ____________________________________________   PROCEDURES  Procedure(s) performed:   Procedures  None ____________________________________________   INITIAL IMPRESSION / ASSESSMENT AND PLAN / ED COURSE  Pertinent labs & imaging results that were available during my care of the patient were reviewed by me and considered in my medical decision making (see chart for details).   Patient presents to the ED with abdominal cramping, diarrhea, and aches. CP and SOB are mild and appear 2/2 GI discomfort. No hypoxemia. Doubt ACS/PE. No focal tenderness on abdominal exam. Pain worse just prior to diarrhea. Doubt colitis or complicated diverticulitis. No CT imaging at this time. Unable to provide stool sample in the ED for testing. Low risk for c. Diff. CXR and EKG reviewed along with labs. Will send COVID testing. Patient to isolate and f/u results on MyChart app. Discussed importance of PCP follow up.    ____________________________________________  FINAL CLINICAL IMPRESSION(S) / ED DIAGNOSES  Final diagnoses:  Diarrhea of presumed infectious origin  Flu-like symptoms     MEDICATIONS GIVEN DURING THIS VISIT:  Medications  sodium chloride 0.9 % bolus 1,000 mL ( Intravenous Stopped 01/18/19 1929)     Note:  This document was prepared using Dragon voice recognition software and may include unintentional dictation errors.  Nanda Quinton, MD, [facility] Emergency Medicine    Dadrian Ballantine, Wonda Olds, MD 01/20/19 1134

## 2019-01-18 NOTE — ED Notes (Signed)
PT denies pain. N/V/D

## 2019-01-18 NOTE — ED Triage Notes (Addendum)
Pt c/o abd pain, diarrhea, muscle cramps and SOB-sx started 1 week ago-NAD-steady gait-pt added he had "some" chest pain earlier

## 2019-01-18 NOTE — ED Notes (Signed)
ED Provider at bedside. 

## 2019-01-18 NOTE — ED Notes (Signed)
Xray informed of portable chest ordered per MD.

## 2019-01-18 NOTE — Discharge Instructions (Signed)
You were seen in the emergency department today with diarrhea and other flulike symptoms.  I am prescribing Imodium which you can take to relieve your diarrhea symptoms.  Take Tylenol as needed for body aches and any fever that may develop.  I am testing you for COVID-19.  You can follow the results and MyChart.  Information is provided in his discharge paperwork about how to set that up.  Please isolate yourself from others and maintain social distance along with wearing a mask.

## 2019-01-19 LAB — SARS CORONAVIRUS 2 (TAT 6-24 HRS): SARS Coronavirus 2: NEGATIVE

## 2019-10-31 ENCOUNTER — Encounter (HOSPITAL_BASED_OUTPATIENT_CLINIC_OR_DEPARTMENT_OTHER): Payer: Self-pay

## 2019-10-31 ENCOUNTER — Other Ambulatory Visit: Payer: Self-pay

## 2019-10-31 DIAGNOSIS — Y999 Unspecified external cause status: Secondary | ICD-10-CM | POA: Insufficient documentation

## 2019-10-31 DIAGNOSIS — Z23 Encounter for immunization: Secondary | ICD-10-CM | POA: Insufficient documentation

## 2019-10-31 DIAGNOSIS — I10 Essential (primary) hypertension: Secondary | ICD-10-CM | POA: Diagnosis not present

## 2019-10-31 DIAGNOSIS — Y9289 Other specified places as the place of occurrence of the external cause: Secondary | ICD-10-CM | POA: Diagnosis not present

## 2019-10-31 DIAGNOSIS — W57XXXA Bitten or stung by nonvenomous insect and other nonvenomous arthropods, initial encounter: Secondary | ICD-10-CM | POA: Insufficient documentation

## 2019-10-31 DIAGNOSIS — C189 Malignant neoplasm of colon, unspecified: Secondary | ICD-10-CM | POA: Diagnosis not present

## 2019-10-31 DIAGNOSIS — C679 Malignant neoplasm of bladder, unspecified: Secondary | ICD-10-CM | POA: Insufficient documentation

## 2019-10-31 DIAGNOSIS — Y939 Activity, unspecified: Secondary | ICD-10-CM | POA: Diagnosis not present

## 2019-10-31 DIAGNOSIS — S70362A Insect bite (nonvenomous), left thigh, initial encounter: Secondary | ICD-10-CM | POA: Insufficient documentation

## 2019-10-31 DIAGNOSIS — Z87891 Personal history of nicotine dependence: Secondary | ICD-10-CM | POA: Insufficient documentation

## 2019-10-31 NOTE — ED Triage Notes (Signed)
Pt had a tick attach to his inner L thigh. He attempted to remove it and the head broke off under the skin.

## 2019-11-01 ENCOUNTER — Emergency Department (HOSPITAL_BASED_OUTPATIENT_CLINIC_OR_DEPARTMENT_OTHER)
Admission: EM | Admit: 2019-11-01 | Discharge: 2019-11-01 | Disposition: A | Discharge status: Home / Self Care | Payer: Medicare HMO | Attending: Emergency Medicine | Admitting: Emergency Medicine

## 2019-11-01 DIAGNOSIS — S70362A Insect bite (nonvenomous), left thigh, initial encounter: Secondary | ICD-10-CM

## 2019-11-01 DIAGNOSIS — W57XXXA Bitten or stung by nonvenomous insect and other nonvenomous arthropods, initial encounter: Secondary | ICD-10-CM

## 2019-11-01 MED ORDER — DOXYCYCLINE HYCLATE 100 MG PO TABS
200.0000 mg | ORAL_TABLET | Freq: Once | ORAL | Status: AC
  Administered 2019-11-01: 200 mg via ORAL
  Filled 2019-11-01: qty 2

## 2019-11-01 MED ORDER — TETANUS-DIPHTH-ACELL PERTUSSIS 5-2.5-18.5 LF-MCG/0.5 IM SUSP
0.5000 mL | Freq: Once | INTRAMUSCULAR | Status: AC
  Administered 2019-11-01: 0.5 mL via INTRAMUSCULAR
  Filled 2019-11-01: qty 0.5

## 2019-11-01 NOTE — ED Provider Notes (Signed)
[place] DEPT [facility] Provider Note: Georgena Spurling, MD, FACEP  CSN: [id number] MRN: [id number] ARRIVAL: 10/31/19 at 2126 ROOM: [place]  Tick Removal   HISTORY OF PRESENT ILLNESS  11/01/19 2:59 AM Justin Conway is a 79 y.o. male who noticed a tick attached to his inner left thigh yesterday evening.  He attempted to remove it but the head broke off under the skin.  He is having no associated pain.    Past Medical History:  Diagnosis Date  . Bladder cancer ([place])   . Colon cancer ([place])   . Hypertension     Past Surgical History:  Procedure Laterality Date  . BLADDER SURGERY    . CHOLECYSTECTOMY    . COLON SURGERY    . HERNIA REPAIR      No family history on file.  Social History   Tobacco Use  . Smoking status: Former Research scientist (life sciences)  . Smokeless tobacco: Never Used  Substance Use Topics  . Alcohol use: Not Currently  . Drug use: Never    Prior to Admission medications   Medication Sig Start Date End Date Taking? Authorizing Provider  amLODipine (NORVASC) 10 MG tablet Take 10 mg by mouth daily.    [provider]  aspirin 81 MG chewable tablet Chew by mouth daily.    [provider]  diphenhydrAMINE (BENADRYL) 25 MG tablet Take 25 mg by mouth every 6 (six) hours as needed.    [provider]  DULoxetine (CYMBALTA) 20 MG capsule Take 20 mg by mouth daily.    [provider]  Glycopyrrolate (ROBINUL PO) Take by mouth.    [provider]  hydrALAZINE (APRESOLINE) 25 MG tablet Take 25 mg by mouth 3 (three) times daily.    [provider]  loperamide (IMODIUM) 2 MG capsule Take 1 capsule (2 mg total) by mouth 4 (four) times daily as needed for diarrhea or loose stools. 01/18/19   Long, Wonda Olds, MD  losartan (COZAAR) 100 MG tablet Take 100 mg by mouth daily.    [provider]  meloxicam (MOBIC) 7.5 MG tablet Take 7.5 mg by mouth daily.    [provider]  omeprazole (PRILOSEC) 40 MG  capsule Take 40 mg by mouth daily.    [provider]    Allergies Morphine and related, Penicillins, and Prednisone   REVIEW OF SYSTEMS  Negative except as noted here or in the History of Present Illness.   PHYSICAL EXAMINATION  Initial Vital Signs Blood pressure (!) 168/66, pulse 65, temperature 97.9 F (36.6 C), temperature source Oral, resp. rate 16, height 5\' 11"  (1.803 m), weight 76.2 kg, SpO2 98 %.  Examination General: Well-developed, well-nourished male in no acute distress; appearance consistent with age of record HENT: normocephalic; atraumatic Eyes: Normal appearance Neck: supple Heart: regular rate and rhythm Lungs: clear to auscultation bilaterally Abdomen: soft; nondistended; nontender; bowel sounds present Extremities: No deformity; full range of motion; pulses normal Neurologic: Awake, alert and oriented; motor function intact in all extremities and symmetric; no facial droop Skin: Warm and dry; shallow ulceration of skin of left medial, proximal thigh with small foreign object visible in wound Psychiatric: Normal mood and affect   RESULTS  Summary of this visit's results, reviewed and interpreted by myself:   EKG Interpretation  Date/Time:    Ventricular Rate:    PR Interval:    QRS Duration:   QT Interval:    QTC Calculation:   R Axis:  Text Interpretation:        Laboratory Studies: No results found for this or any previous visit (from the past 24 hour(s)). Imaging Studies: No results found.  ED COURSE and MDM  Nursing notes, initial and subsequent vitals signs, including pulse oximetry, reviewed and interpreted by myself.  Vitals:   10/31/19 2149 11/01/19 0202  BP: (!) 157/58 (!) 168/66  Pulse: 68 65  Resp: 20 16  Temp: 98.2 F (36.8 C) 97.9 F (36.6 C)  TempSrc: Oral Oral  SpO2: 99% 98%  Weight: 76.2 kg   Height: 5\' 11"  (1.803 m)    Medications  doxycycline (VIBRA-TABS) tablet 200 mg (has no administration in time  range)  Tdap (BOOSTRIX) injection 0.5 mL (has no administration in time range)    Foreign object removed from patient's wound with an 18-gauge needle.  He tolerated this well and there were no immediate complications.  He was given 200 mg of doxycycline for tick bite prophylaxis.  He was also given a Tdap.  PROCEDURES  Procedures   ED DIAGNOSES     ICD-10-CM   1. Tick bite of left thigh, initial encounter  S70.362A    W57.Encarnacion Chu, MD 11/01/19 [phone]

## 2019-11-01 NOTE — ED Notes (Signed)
Pt states he noted a tick today, attempted to remove tick and was unsuccessful getting full body of tick off. No noticeable tick remains on area.

## 2020-11-23 ENCOUNTER — Emergency Department (HOSPITAL_BASED_OUTPATIENT_CLINIC_OR_DEPARTMENT_OTHER)
Admission: EM | Admit: 2020-11-23 | Discharge: 2020-11-23 | Disposition: A | Discharge status: Home / Self Care | Payer: Medicare HMO | Attending: Emergency Medicine | Admitting: Emergency Medicine

## 2020-11-23 ENCOUNTER — Emergency Department (HOSPITAL_BASED_OUTPATIENT_CLINIC_OR_DEPARTMENT_OTHER): Payer: Medicare HMO

## 2020-11-23 ENCOUNTER — Encounter (HOSPITAL_BASED_OUTPATIENT_CLINIC_OR_DEPARTMENT_OTHER): Payer: Self-pay

## 2020-11-23 ENCOUNTER — Other Ambulatory Visit: Payer: Self-pay

## 2020-11-23 DIAGNOSIS — Z8551 Personal history of malignant neoplasm of bladder: Secondary | ICD-10-CM | POA: Diagnosis not present

## 2020-11-23 DIAGNOSIS — Z87891 Personal history of nicotine dependence: Secondary | ICD-10-CM | POA: Diagnosis not present

## 2020-11-23 DIAGNOSIS — Z79899 Other long term (current) drug therapy: Secondary | ICD-10-CM | POA: Diagnosis not present

## 2020-11-23 DIAGNOSIS — X500XXA Overexertion from strenuous movement or load, initial encounter: Secondary | ICD-10-CM | POA: Diagnosis not present

## 2020-11-23 DIAGNOSIS — Z85038 Personal history of other malignant neoplasm of large intestine: Secondary | ICD-10-CM | POA: Diagnosis not present

## 2020-11-23 DIAGNOSIS — Z7982 Long term (current) use of aspirin: Secondary | ICD-10-CM | POA: Insufficient documentation

## 2020-11-23 DIAGNOSIS — R079 Chest pain, unspecified: Secondary | ICD-10-CM | POA: Diagnosis not present

## 2020-11-23 DIAGNOSIS — S4991XA Unspecified injury of right shoulder and upper arm, initial encounter: Secondary | ICD-10-CM | POA: Diagnosis present

## 2020-11-23 DIAGNOSIS — S46911A Strain of unspecified muscle, fascia and tendon at shoulder and upper arm level, right arm, initial encounter: Secondary | ICD-10-CM | POA: Diagnosis not present

## 2020-11-23 DIAGNOSIS — I10 Essential (primary) hypertension: Secondary | ICD-10-CM | POA: Diagnosis not present

## 2020-11-23 DIAGNOSIS — S46912A Strain of unspecified muscle, fascia and tendon at shoulder and upper arm level, left arm, initial encounter: Secondary | ICD-10-CM | POA: Insufficient documentation

## 2020-11-23 DIAGNOSIS — M546 Pain in thoracic spine: Secondary | ICD-10-CM | POA: Diagnosis not present

## 2020-11-23 LAB — CBC
HCT: 38 % — ABNORMAL LOW (ref 39.0–52.0)
Hemoglobin: 12.5 g/dL — ABNORMAL LOW (ref 13.0–17.0)
MCH: 30.4 pg (ref 26.0–34.0)
MCHC: 32.9 g/dL (ref 30.0–36.0)
MCV: 92.5 fL (ref 80.0–100.0)
Platelets: 251 10*3/uL (ref 150–400)
RBC: 4.11 MIL/uL — ABNORMAL LOW (ref 4.22–5.81)
RDW: 12.1 % (ref 11.5–15.5)
WBC: 8.4 10*3/uL (ref 4.0–10.5)
nRBC: 0 % (ref 0.0–0.2)

## 2020-11-23 LAB — TROPONIN I (HIGH SENSITIVITY): Troponin I (High Sensitivity): 4 ng/L (ref <18)

## 2020-11-23 LAB — BASIC METABOLIC PANEL
Anion gap: 7 (ref 5–15)
BUN: 15 mg/dL (ref 8–23)
CO2: 31 mmol/L (ref 22–32)
Calcium: 9.7 mg/dL (ref 8.9–10.3)
Chloride: 103 mmol/L (ref 98–111)
Creatinine, Ser: 1.42 mg/dL — ABNORMAL HIGH (ref 0.61–1.24)
GFR, Estimated: 50 mL/min — ABNORMAL LOW (ref >60)
Glucose, Bld: 141 mg/dL — ABNORMAL HIGH (ref 70–99)
Potassium: 3.9 mmol/L (ref 3.5–5.1)
Sodium: 141 mmol/L (ref 135–145)

## 2020-11-23 LAB — CK: Total CK: 29 U/L — ABNORMAL LOW (ref 49–397)

## 2020-11-23 MED ORDER — ACETAMINOPHEN 500 MG PO TABS
1000.0000 mg | ORAL_TABLET | Freq: Once | ORAL | Status: AC
  Administered 2020-11-23: 1000 mg via ORAL
  Filled 2020-11-23: qty 2

## 2020-11-23 MED ORDER — SODIUM CHLORIDE 0.9 % IV BOLUS
1000.0000 mL | Freq: Once | INTRAVENOUS | Status: AC
  Administered 2020-11-23: 1000 mL via INTRAVENOUS

## 2020-11-23 NOTE — ED Provider Notes (Signed)
[place] EMERGENCY DEPARTMENT Provider Note   CSN: [id number] Arrival date & time: 11/23/20  1024     History Chief Complaint  Patient presents with   Chest Pain    Justin Conway is a 80 y.o. male.  HPI 80 year old male presents with bilateral shoulder pain.  He states this has been ongoing for a couple weeks.  He thinks it was because he was working with his son on a project.  The pain does not seem to be getting better or worse.  His left shoulder hurts worse than his right, especially with movement.  He has had a little bit of chest discomfort but is much worse in his shoulder.  Some discomfort in his bilateral back.  Both legs have been sore as well.  He has not had a fever, cough, shortness of breath.  No change in urine. Has taken ibuprofen.  Past Medical History:  Diagnosis Date   Bladder cancer [facility])    Colon cancer ([place])    Hypertension     There are no problems to display for this patient.   Past Surgical History:  Procedure Laterality Date   BLADDER SURGERY     CHOLECYSTECTOMY     COLON SURGERY     HERNIA REPAIR         No family history on file.  Social History   Tobacco Use   Smoking status: Former   Smokeless tobacco: Never  Substance Use Topics   Alcohol use: Not Currently   Drug use: Never    Home Medications Prior to Admission medications   Medication Sig Start Date End Date Taking? Authorizing Provider  amLODipine (NORVASC) 10 MG tablet Take 10 mg by mouth daily.    [provider]  aspirin 81 MG chewable tablet Chew by mouth daily.    [provider]  diphenhydrAMINE (BENADRYL) 25 MG tablet Take 25 mg by mouth every 6 (six) hours as needed.    [provider]  DULoxetine (CYMBALTA) 20 MG capsule Take 20 mg by mouth daily.    [provider]  Glycopyrrolate (ROBINUL PO) Take by mouth.    [provider]  hydrALAZINE (APRESOLINE) 25 MG tablet Take 25 mg by mouth 3 (three) times  daily.    [provider]  loperamide (IMODIUM) 2 MG capsule Take 1 capsule (2 mg total) by mouth 4 (four) times daily as needed for diarrhea or loose stools. 01/18/19   Long, Wonda Olds, MD  losartan (COZAAR) 100 MG tablet Take 100 mg by mouth daily.    [provider]  meloxicam (MOBIC) 7.5 MG tablet Take 7.5 mg by mouth daily.    [provider]  omeprazole (PRILOSEC) 40 MG capsule Take 40 mg by mouth daily.    [provider]    Allergies    Morphine and related, Penicillins, and Prednisone  Review of Systems   Review of Systems  Constitutional:  Negative for fever.  Respiratory:  Negative for cough and shortness of breath.   Cardiovascular:  Positive for chest pain.  Gastrointestinal:  Positive for abdominal pain (chronic, uchanged) and diarrhea (chronic, unchanged).  Musculoskeletal:  Positive for arthralgias and myalgias.  Neurological:  Negative for weakness.  All other systems reviewed and are negative.  Physical Exam Updated Vital Signs BP (!) 143/59 (BP Location: Right Arm)   Pulse 65   Temp 98.4 F (36.9 C) (Oral)   Resp 16   Ht 6' (1.829 m)   Wt 81.7  kg   SpO2 93%   BMI 24.43 kg/m   Physical Exam Vitals and nursing note reviewed.  Constitutional:      General: He is not in acute distress.    Appearance: He is well-developed. He is not ill-appearing or diaphoretic.  HENT:     Head: Normocephalic and atraumatic.     Right Ear: External ear normal.     Left Ear: External ear normal.     Nose: Nose normal.  Eyes:     General:        Right eye: No discharge.        Left eye: No discharge.  Cardiovascular:     Rate and Rhythm: Normal rate and regular rhythm.     Heart sounds: Normal heart sounds.  Pulmonary:     Effort: Pulmonary effort is normal.     Breath sounds: Normal breath sounds.  Abdominal:     Palpations: Abdomen is soft.     Tenderness: There is no abdominal tenderness.  Musculoskeletal:     Right shoulder:  Tenderness (mild) present. Normal range of motion.     Left shoulder: Tenderness present. No deformity. Decreased range of motion (painful).     Cervical back: Neck supple.     Thoracic back: Tenderness (paraspinal) present.       Back:     Right upper leg: No tenderness.     Right lower leg: No tenderness.     Left lower leg: No tenderness.  Skin:    General: Skin is warm and dry.  Neurological:     Mental Status: He is alert.  Psychiatric:        Mood and Affect: Mood is not anxious.    ED Results / Procedures / Treatments   Labs (all labs ordered are listed, but only abnormal results are displayed) Labs Reviewed  BASIC METABOLIC PANEL - Abnormal; Notable for the following components:      Result Value   Glucose, Bld 141 (*)    Creatinine, Ser 1.42 (*)    GFR, Estimated 50 (*)    All other components within normal limits  CBC - Abnormal; Notable for the following components:   RBC 4.11 (*)    Hemoglobin 12.5 (*)    HCT 38.0 (*)    All other components within normal limits  CK - Abnormal; Notable for the following components:   Total CK 29 (*)    All other components within normal limits  TROPONIN I (HIGH SENSITIVITY)    EKG EKG Interpretation  Date/Time:  Monday November 23 2020 10:33:29 EDT Ventricular Rate:  82 PR Interval:  190 QRS Duration: 96 QT Interval:  362 QTC Calculation: 423 R Axis:   36 Text Interpretation: Sinus rhythm Atrial premature complex no significant change since 2020 Confirmed by Sherwood Gambler [phone]) on 11/23/2020 10:34:14 AM  Radiology DG Chest 2 View  Result Date: 11/23/2020 CLINICAL DATA:  Chest pain. EXAM: CHEST - 2 VIEW COMPARISON:  January 18, 2019. FINDINGS: The heart size and mediastinal contours are within normal limits. Both lungs are clear. The visualized skeletal structures are unremarkable. IMPRESSION: No active cardiopulmonary disease. Aortic Atherosclerosis (ICD10-I70.0). Electronically Signed   By: Marijo Conception M.D.   On:  11/23/2020 11:31   DG Shoulder Right  Result Date: 11/23/2020 CLINICAL DATA:  Acute right shoulder pain. EXAM: RIGHT SHOULDER - 2+ VIEW COMPARISON:  None. FINDINGS: There is no evidence of fracture or dislocation. There is no evidence of arthropathy or other  focal bone abnormality. Soft tissues are unremarkable. IMPRESSION: Negative. Electronically Signed   By: Marijo Conception M.D.   On: 11/23/2020 11:34   DG Shoulder Left  Result Date: 11/23/2020 CLINICAL DATA:  Acute left shoulder pain. EXAM: LEFT SHOULDER - 2+ VIEW COMPARISON:  None. FINDINGS: There is no evidence of fracture or dislocation. There is no evidence of arthropathy or other focal bone abnormality. Soft tissues are unremarkable. IMPRESSION: Negative. Electronically Signed   By: Marijo Conception M.D.   On: 11/23/2020 11:33    Procedures Procedures   Medications Ordered in ED Medications  acetaminophen (TYLENOL) tablet 1,000 mg (1,000 mg Oral Given 11/23/20 1056)  sodium chloride 0.9 % bolus 1,000 mL (0 mLs Intravenous Stopped 11/23/20 1233)    ED Course  I have reviewed the triage vital signs and the nursing notes.  Pertinent labs & imaging results that were available during my care of the patient were reviewed by me and considered in my medical decision making (see chart for details).    MDM Rules/Calculators/A&P                           Patient symptoms seem to start while he was doing this project with his son for several days.  Feels like he got sore or may be injured himself with lifting and working.  Primarily seems to be in his shoulders, left greater than right but also a little bit in his legs.  Decreased range of motion his left shoulder.  Has strong radial pulses.  Overall this seems like an overuse injury and perhaps a rotator cuff injury but no emergent findings.  It was originally told as chest pain but he states the chest pain seems to come and go over the last couple weeks and is not really the reason he is  here.  Troponin is negative and with several weeks of symptoms I think ACS is unlikely and a second is not needed.  Does not really sound infectious.  CK is benign.  Will discharge home with over-the-counter pain medication and follow-up with PCP. Final Clinical Impression(s) / ED Diagnoses Final diagnoses:  Strain of left shoulder, initial encounter  Strain of right shoulder, initial encounter    Rx / DC Orders ED Discharge Orders     None        Sherwood Gambler, MD 11/23/20 1457

## 2020-11-23 NOTE — ED Notes (Signed)
Patient transported to X-ray 

## 2020-11-23 NOTE — Discharge Instructions (Addendum)
Follow-up with your family doctor for your shoulder injuries.  You may need outpatient MRI and/or physical therapy.  If you develop new or worsening chest pain, shortness of breath, fever, weakness or numbness in the extremities, or any other new/concerning symptoms then return to the ER for evaluation.

## 2020-11-23 NOTE — ED Triage Notes (Signed)
Pt arrives with c/o generalized body aches and Chest pains for 2 weeks pt states" I thought maybe I had that virus but never had a fever".

## 2021-09-05 ENCOUNTER — Emergency Department (HOSPITAL_BASED_OUTPATIENT_CLINIC_OR_DEPARTMENT_OTHER): Payer: Medicare Other

## 2021-09-05 ENCOUNTER — Encounter (HOSPITAL_BASED_OUTPATIENT_CLINIC_OR_DEPARTMENT_OTHER): Payer: Self-pay | Admitting: Obstetrics and Gynecology

## 2021-09-05 ENCOUNTER — Observation Stay (HOSPITAL_BASED_OUTPATIENT_CLINIC_OR_DEPARTMENT_OTHER)
Admission: EM | Admit: 2021-09-05 | Discharge: 2021-09-06 | Disposition: A | Discharge status: Home / Self Care | Payer: Medicare Other | Attending: Internal Medicine | Admitting: Internal Medicine

## 2021-09-05 ENCOUNTER — Other Ambulatory Visit: Payer: Self-pay

## 2021-09-05 DIAGNOSIS — Z8551 Personal history of malignant neoplasm of bladder: Secondary | ICD-10-CM | POA: Diagnosis not present

## 2021-09-05 DIAGNOSIS — I10 Essential (primary) hypertension: Secondary | ICD-10-CM | POA: Diagnosis not present

## 2021-09-05 DIAGNOSIS — J9601 Acute respiratory failure with hypoxia: Secondary | ICD-10-CM | POA: Diagnosis present

## 2021-09-05 DIAGNOSIS — Z79899 Other long term (current) drug therapy: Secondary | ICD-10-CM | POA: Insufficient documentation

## 2021-09-05 DIAGNOSIS — B029 Zoster without complications: Secondary | ICD-10-CM | POA: Diagnosis present

## 2021-09-05 DIAGNOSIS — Z7952 Long term (current) use of systemic steroids: Secondary | ICD-10-CM | POA: Diagnosis not present

## 2021-09-05 DIAGNOSIS — M25511 Pain in right shoulder: Secondary | ICD-10-CM | POA: Insufficient documentation

## 2021-09-05 DIAGNOSIS — R778 Other specified abnormalities of plasma proteins: Secondary | ICD-10-CM | POA: Diagnosis not present

## 2021-09-05 DIAGNOSIS — B0229 Other postherpetic nervous system involvement: Secondary | ICD-10-CM

## 2021-09-05 DIAGNOSIS — R0902 Hypoxemia: Secondary | ICD-10-CM

## 2021-09-05 DIAGNOSIS — Z87891 Personal history of nicotine dependence: Secondary | ICD-10-CM | POA: Insufficient documentation

## 2021-09-05 DIAGNOSIS — J189 Pneumonia, unspecified organism: Secondary | ICD-10-CM | POA: Diagnosis not present

## 2021-09-05 DIAGNOSIS — Z7982 Long term (current) use of aspirin: Secondary | ICD-10-CM | POA: Diagnosis not present

## 2021-09-05 DIAGNOSIS — Z85038 Personal history of other malignant neoplasm of large intestine: Secondary | ICD-10-CM | POA: Diagnosis not present

## 2021-09-05 DIAGNOSIS — M79601 Pain in right arm: Secondary | ICD-10-CM | POA: Diagnosis present

## 2021-09-05 HISTORY — DX: Long term (current) use of systemic steroids: Z79.52

## 2021-09-05 LAB — CBC
HCT: 39.5 % (ref 39.0–52.0)
Hemoglobin: 13.2 g/dL (ref 13.0–17.0)
MCH: 32.6 pg (ref 26.0–34.0)
MCHC: 33.4 g/dL (ref 30.0–36.0)
MCV: 97.5 fL (ref 80.0–100.0)
Platelets: 193 10*3/uL (ref 150–400)
RBC: 4.05 MIL/uL — ABNORMAL LOW (ref 4.22–5.81)
RDW: 14.4 % (ref 11.5–15.5)
WBC: 21.2 10*3/uL — ABNORMAL HIGH (ref 4.0–10.5)
nRBC: 0 % (ref 0.0–0.2)

## 2021-09-05 LAB — TROPONIN I (HIGH SENSITIVITY)
Troponin I (High Sensitivity): 22 ng/L — ABNORMAL HIGH (ref <18)
Troponin I (High Sensitivity): 22 ng/L — ABNORMAL HIGH (ref <18)

## 2021-09-05 LAB — BASIC METABOLIC PANEL
Anion gap: 11 (ref 5–15)
BUN: 34 mg/dL — ABNORMAL HIGH (ref 8–23)
CO2: 30 mmol/L (ref 22–32)
Calcium: 10.2 mg/dL (ref 8.9–10.3)
Chloride: 101 mmol/L (ref 98–111)
Creatinine, Ser: 1.17 mg/dL (ref 0.61–1.24)
GFR, Estimated: >60 mL/min (ref >60)
Glucose, Bld: 122 mg/dL — ABNORMAL HIGH (ref 70–99)
Potassium: 3.9 mmol/L (ref 3.5–5.1)
Sodium: 142 mmol/L (ref 135–145)

## 2021-09-05 LAB — I-STAT VENOUS BLOOD GAS, ED
Acid-Base Excess: 4 mmol/L — ABNORMAL HIGH (ref 0.0–2.0)
Bicarbonate: 30.1 mmol/L — ABNORMAL HIGH (ref 20.0–28.0)
Calcium, Ion: 1.26 mmol/L (ref 1.15–1.40)
HCT: 36 % — ABNORMAL LOW (ref 39.0–52.0)
Hemoglobin: 12.2 g/dL — ABNORMAL LOW (ref 13.0–17.0)
O2 Saturation: 64 %
Potassium: 4.4 mmol/L (ref 3.5–5.1)
Sodium: 140 mmol/L (ref 135–145)
TCO2: 32 mmol/L (ref 22–32)
pCO2, Ven: 49.2 mmHg (ref 44–60)
pH, Ven: 7.394 (ref 7.25–7.43)
pO2, Ven: 34 mmHg (ref 32–45)

## 2021-09-05 LAB — D-DIMER, QUANTITATIVE: D-Dimer, Quant: 0.57 ug/mL-FEU — ABNORMAL HIGH (ref 0.00–0.50)

## 2021-09-05 LAB — TSH: TSH: 0.8 u[IU]/mL (ref 0.350–4.500)

## 2021-09-05 MED ORDER — ACETAMINOPHEN 325 MG PO TABS: 650.0000 mg | ORAL_TABLET | Freq: Four times a day (QID) | ORAL | Status: DC | PRN

## 2021-09-05 MED ORDER — ROPINIROLE HCL 0.25 MG PO TABS
0.5000 mg | ORAL_TABLET | Freq: Every day | ORAL | Status: DC
  Administered 2021-09-05: 0.5 mg via ORAL
  Filled 2021-09-05: qty 2

## 2021-09-05 MED ORDER — GABAPENTIN 300 MG PO CAPS
300.0000 mg | ORAL_CAPSULE | Freq: Two times a day (BID) | ORAL | Status: DC
Start: 2021-09-06 — End: 2021-09-05

## 2021-09-05 MED ORDER — IOHEXOL 350 MG/ML SOLN
80.0000 mL | Freq: Once | INTRAVENOUS | Status: AC | PRN
  Administered 2021-09-05: 80 mL via INTRAVENOUS

## 2021-09-05 MED ORDER — SODIUM CHLORIDE 0.9 % IV SOLN
1.0000 g | Freq: Once | INTRAVENOUS | Status: AC
  Administered 2021-09-05: 1 g via INTRAVENOUS
  Filled 2021-09-05: qty 10

## 2021-09-05 MED ORDER — SODIUM CHLORIDE 0.9 % IV SOLN: 2.0000 g | INTRAVENOUS | Status: DC

## 2021-09-05 MED ORDER — HYDRALAZINE HCL 25 MG PO TABS
25.0000 mg | ORAL_TABLET | Freq: Every day | ORAL | Status: DC
  Administered 2021-09-06: 25 mg via ORAL
  Filled 2021-09-05: qty 1

## 2021-09-05 MED ORDER — METHYLPREDNISOLONE 2 MG PO TABS: 8.0000 mg | ORAL_TABLET | Freq: Every day | ORAL | Status: DC

## 2021-09-05 MED ORDER — PANTOPRAZOLE SODIUM 40 MG PO TBEC
80.0000 mg | DELAYED_RELEASE_TABLET | Freq: Every day | ORAL | Status: DC
  Administered 2021-09-06: 80 mg via ORAL
  Filled 2021-09-05: qty 2

## 2021-09-05 MED ORDER — METHYLPREDNISOLONE 16 MG PO TABS
16.0000 mg | ORAL_TABLET | Freq: Every day | ORAL | Status: DC
Start: 2021-09-10 — End: 2021-09-06

## 2021-09-05 MED ORDER — GABAPENTIN 300 MG PO CAPS: 300.0000 mg | ORAL_CAPSULE | Freq: Three times a day (TID) | ORAL | Status: DC

## 2021-09-05 MED ORDER — GABAPENTIN 300 MG PO CAPS
300.0000 mg | ORAL_CAPSULE | Freq: Three times a day (TID) | ORAL | Status: DC
  Administered 2021-09-06: 300 mg via ORAL
  Filled 2021-09-05: qty 1

## 2021-09-05 MED ORDER — METHYLPREDNISOLONE 2 MG PO TABS: 20.0000 mg | ORAL_TABLET | Freq: Every day | ORAL | Status: DC

## 2021-09-05 MED ORDER — FERROUS SULFATE 325 (65 FE) MG PO TABS
325.0000 mg | ORAL_TABLET | Freq: Every day | ORAL | Status: DC
  Administered 2021-09-06: 325 mg via ORAL
  Filled 2021-09-05: qty 1

## 2021-09-05 MED ORDER — ENOXAPARIN SODIUM 40 MG/0.4ML IJ SOSY
40.0000 mg | PREFILLED_SYRINGE | INTRAMUSCULAR | Status: DC
  Administered 2021-09-06: 40 mg via SUBCUTANEOUS
  Filled 2021-09-05: qty 0.4

## 2021-09-05 MED ORDER — DULOXETINE HCL 20 MG PO CPEP
20.0000 mg | ORAL_CAPSULE | Freq: Every day | ORAL | Status: DC
  Administered 2021-09-06: 20 mg via ORAL
  Filled 2021-09-05: qty 1

## 2021-09-05 MED ORDER — SODIUM CHLORIDE 0.9 % IV SOLN
500.0000 mg | Freq: Once | INTRAVENOUS | Status: AC
  Administered 2021-09-05: 500 mg via INTRAVENOUS
  Filled 2021-09-05: qty 5

## 2021-09-05 MED ORDER — ONDANSETRON HCL 4 MG/2ML IJ SOLN
4.0000 mg | Freq: Once | INTRAMUSCULAR | Status: AC
  Administered 2021-09-05: 4 mg via INTRAVENOUS
  Filled 2021-09-05: qty 2

## 2021-09-05 MED ORDER — KETOROLAC TROMETHAMINE 15 MG/ML IJ SOLN
15.0000 mg | Freq: Once | INTRAMUSCULAR | Status: AC
  Administered 2021-09-05: 15 mg via INTRAVENOUS
  Filled 2021-09-05: qty 1

## 2021-09-05 MED ORDER — AMLODIPINE BESYLATE 10 MG PO TABS
10.0000 mg | ORAL_TABLET | Freq: Every day | ORAL | Status: DC
  Administered 2021-09-06: 10 mg via ORAL
  Filled 2021-09-05: qty 1

## 2021-09-05 MED ORDER — AZITHROMYCIN 250 MG PO TABS
500.0000 mg | ORAL_TABLET | Freq: Every day | ORAL | Status: DC
  Administered 2021-09-06: 500 mg via ORAL
  Filled 2021-09-05: qty 2

## 2021-09-05 MED ORDER — GABAPENTIN 300 MG PO CAPS
600.0000 mg | ORAL_CAPSULE | Freq: Once | ORAL | Status: AC
  Administered 2021-09-05: 600 mg via ORAL
  Filled 2021-09-05: qty 2

## 2021-09-05 MED ORDER — HYDROMORPHONE HCL 1 MG/ML IJ SOLN
0.5000 mg | Freq: Once | INTRAMUSCULAR | Status: AC
  Administered 2021-09-05: 0.5 mg via INTRAVENOUS
  Filled 2021-09-05: qty 1

## 2021-09-05 MED ORDER — METHYLPREDNISOLONE 2 MG PO TABS: 4.0000 mg | ORAL_TABLET | ORAL | Status: DC

## 2021-09-05 MED ORDER — HYDROCODONE-ACETAMINOPHEN 7.5-325 MG PO TABS: 0.5000 | ORAL_TABLET | Freq: Four times a day (QID) | ORAL | Status: DC | PRN

## 2021-09-05 MED ORDER — ONDANSETRON HCL 4 MG/2ML IJ SOLN: 4.0000 mg | Freq: Four times a day (QID) | INTRAMUSCULAR | Status: DC | PRN

## 2021-09-05 MED ORDER — GABAPENTIN 300 MG PO CAPS
300.0000 mg | ORAL_CAPSULE | Freq: Once | ORAL | Status: DC
  Filled 2021-09-05: qty 1

## 2021-09-05 MED ORDER — ASPIRIN 81 MG PO CHEW
324.0000 mg | CHEWABLE_TABLET | Freq: Once | ORAL | Status: AC
  Administered 2021-09-05: 324 mg via ORAL
  Filled 2021-09-05: qty 4

## 2021-09-05 MED ORDER — SODIUM CHLORIDE 0.9 % IV BOLUS
1000.0000 mL | Freq: Once | INTRAVENOUS | Status: AC
  Administered 2021-09-05: 1000 mL via INTRAVENOUS

## 2021-09-05 MED ORDER — METHYLPREDNISOLONE 2 MG PO TABS
4.0000 mg | ORAL_TABLET | Freq: Every day | ORAL | Status: DC
Start: 2021-09-19 — End: 2021-09-06

## 2021-09-05 MED ORDER — GABAPENTIN 300 MG PO CAPS: 300.0000 mg | ORAL_CAPSULE | Freq: Every day | ORAL | Status: DC

## 2021-09-05 MED ORDER — METHYLPREDNISOLONE 2 MG PO TABS
24.0000 mg | ORAL_TABLET | Freq: Every day | ORAL | Status: AC
  Administered 2021-09-06: 24 mg via ORAL
  Filled 2021-09-05: qty 4

## 2021-09-05 MED ORDER — ALENDRONATE SODIUM 70 MG PO TABS: 70.0000 mg | ORAL_TABLET | ORAL | Status: DC

## 2021-09-05 MED ORDER — IPRATROPIUM-ALBUTEROL 0.5-2.5 (3) MG/3ML IN SOLN
3.0000 mL | Freq: Once | RESPIRATORY_TRACT | Status: AC
  Administered 2021-09-05: 3 mL via RESPIRATORY_TRACT
  Filled 2021-09-05: qty 3

## 2021-09-05 MED ORDER — LIDOCAINE 5 % EX OINT
1.0000 "application " | TOPICAL_OINTMENT | Freq: Every day | CUTANEOUS | Status: DC
  Administered 2021-09-05 – 2021-09-06 (×3): 1 via TOPICAL
  Filled 2021-09-05: qty 35.44

## 2021-09-05 MED ORDER — METHYLPREDNISOLONE 2 MG PO TABS
12.0000 mg | ORAL_TABLET | Freq: Every day | ORAL | Status: DC
Start: 2021-09-13 — End: 2021-09-06

## 2021-09-05 NOTE — ED Notes (Signed)
Son here to get his mom

## 2021-09-05 NOTE — Assessment & Plan Note (Addendum)
Chronically uses '20mg'$  prednisone daily for "fibromyalgia" apparently. Currently on hold as he is on burst taper of medrol for post-herpetic neuralgia. 1. Continue burst taper of medrol 2. Followed by resumption of chronic steroids.

## 2021-09-05 NOTE — ED Notes (Signed)
Patient O2 on the monitor reading 86-89%. Patient was found on 2 L connected to O2 tank. Patient placed back on prior O2 setting of 3 L through O2 flowmeter. Patient is not in distress and O2 93-95% on 3 L [place].

## 2021-09-05 NOTE — ED Provider Notes (Signed)
Signout from Dr. Pearline Cables.  81 year old male with recent diagnosis of shingles continues with chest and arm pain from shingles.  He is newly requiring oxygen and chest x-ray shows atelectasis vs pneumonia.  He has received IV antibiotics and oxygen and is pending a CT angiogram chest.  Disposition per results of testing. Physical Exam  BP (!) 153/91   Pulse 95   Temp 97.8 F (36.6 C) (Oral)   Resp 17   Ht '5\' 11"'$  (1.803 m)   Wt 80.3 kg   SpO2 97%   BMI 24.69 kg/m   Physical Exam  Procedures  Procedures  ED Course / MDM   Clinical Course as of 09/05/21 1605  Sun Sep 05, 2021  1536 82 is PORT score  [SG]    Clinical Course User Index [SG] Jeanell Sparrow, DO   Medical Decision Making Amount and/or Complexity of Data Reviewed Labs: ordered. Radiology: ordered.  Risk OTC drugs. Prescription drug management. Decision regarding hospitalization.   CT does not show any evidence of PE.  Does have significant left lower lobe infiltrate.  Still requiring oxygen.  Otherwise well-appearing.  Herpes-like lesions are healed without any significant vesicles.  Discussed with [facility] hospitalist who will put the patient in for a bed for admission for pneumonia and hypoxia.  Patient in agreement with plan.       Hayden Rasmussen, MD 09/06/21 1018

## 2021-09-05 NOTE — ED Notes (Signed)
Pt care taken, resting, waiting for transfer to [facility].  Alert and oriented x 4, no complaints at this time, VS stable.

## 2021-09-05 NOTE — Assessment & Plan Note (Addendum)
LLL PNA with hypoxia. 1. CAP pathway 2. Rocephin + azithro 3. Urine s.pneumo and legionella 4. Trend WBC

## 2021-09-05 NOTE — Assessment & Plan Note (Addendum)
Shingles of chest. Ongoing for 2-3 weeks now. Lesions appear healed. 1. Not really any benefit to antiviral therapy at this late a date (>72h since onset, healing/healed lesions). 2. Pain control for post herpetic neuralgia: 1. Currently on gabapentin '300mg'$  TID 2. Increase as needed by 300 mg every three days up to 600 mg three times daily 1. Will start 300/300/600 now. 3. Cautious use of opiates if needed: though h/o hallucinations to morphine, so prefer to avoid if possible. 1. Cont home norco PRN 4. Continue medrol burst taper

## 2021-09-05 NOTE — ED Notes (Signed)
Report to floor  5 east

## 2021-09-05 NOTE — ED Notes (Signed)
Pt complaining of pain in the rt shoulder from shingles, given 0.5 of dilaudid for the pain.

## 2021-09-05 NOTE — ED Provider Notes (Signed)
[facility] Provider Note   CSN: [id number] Arrival date & time: 09/05/21  1220     History  Chief Complaint  Patient presents with   Arm Pain    PACER Justin Conway is a 81 y.o. male.   Patient as above with significant medical history as below, including bladder cancer, colon cancer, hypertension who presents to the ED with complaint of right arm and shoulder pain.  Patient was diagnosed with shingles approximately 3 weeks ago.  He has completed valacyclovir.  He is still taking prednisone and analgesics including gabapentin and Norco.  Patient reports pain to his right arm has become uncontrolled with home analgesics.  Denies recent fall or injury.  Does have visible rash to his right shoulder, right upper extremity.  Right chest wall.  Reports with exertion but does become fatigued, somewhat short of breath, worsening pain.  No cough, fevers, chills, nausea or vomiting.  No change in bowel or bladder function.  No lesions to face, no ear or eye pain   Patient is hard of hearing  Past Medical History: No date: Bladder cancer ([place]) No date: Colon cancer ([place]) No date: Hypertension  Past Surgical History: No date: BLADDER SURGERY No date: CHOLECYSTECTOMY No date: COLON SURGERY No date: HERNIA REPAIR    The history is provided by the patient and the spouse. No language interpreter was used.  Arm Pain Associated symptoms include chest pain and shortness of breath. Pertinent negatives include no abdominal pain and no headaches.      Home Medications Prior to Admission medications   Medication Sig Start Date End Date Taking? Authorizing Provider  amLODipine (NORVASC) 10 MG tablet Take 10 mg by mouth daily.   Yes [provider]  DULoxetine (CYMBALTA) 20 MG capsule Take 20 mg by mouth daily.   Yes [provider]  Glycopyrrolate (ROBINUL PO) Take by mouth.   Yes [provider]  hydrALAZINE (APRESOLINE) 25 MG tablet Take  25 mg by mouth 3 (three) times daily.   Yes [provider]  loperamide (IMODIUM) 2 MG capsule Take 1 capsule (2 mg total) by mouth 4 (four) times daily as needed for diarrhea or loose stools. 01/18/19  Yes Long, Wonda Old[facility], MD  omeprazole (PRILOSEC) 40 MG capsule Take 40 mg by mouth daily.   Yes [provider]  aspirin 81 MG chewable tablet Chew by mouth daily.    [provider]  diphenhydrAMINE (BENADRYL) 25 MG tablet Take 25 mg by mouth every 6 (six) hours as needed.    [provider]  losartan (COZAAR) 100 MG tablet Take 100 mg by mouth daily.    [provider]  meloxicam (MOBIC) 7.5 MG tablet Take 7.5 mg by mouth daily.    [provider]      Allergies    Morphine and related, Penicillins, and Prednisone    Review of Systems   Review of Systems  Constitutional:  Positive for fatigue. Negative for chills and fever.  HENT:  Negative for facial swelling and trouble swallowing.   Eyes:  Negative for photophobia and visual disturbance.  Respiratory:  Positive for shortness of breath. Negative for cough.   Cardiovascular:  Positive for chest pain. Negative for palpitations.  Gastrointestinal:  Negative for abdominal pain, nausea and vomiting.  Endocrine: Negative for polydipsia and polyuria.  Genitourinary:  Negative for difficulty urinating and hematuria.  Musculoskeletal:  Positive for arthralgias. Negative for gait problem and joint swelling.  Skin:  Negative  for pallor and rash.  Neurological:  Negative for syncope and headaches.  Psychiatric/Behavioral:  Negative for agitation and confusion.    Physical Exam Updated Vital Signs BP (!) 166/84   Pulse 98   Temp 97.8 F (36.6 C) (Oral)   Resp 18   Ht '5\' 11"'$  (1.803 m)   Wt 80.3 kg   SpO2 97%   BMI 24.69 kg/m  Physical Exam Vitals and nursing note reviewed.  Constitutional:      General: He is not in acute distress.    Appearance: He is well-developed.  HENT:      Head: Normocephalic and atraumatic.     Comments: No vesicular lesions to face    Right Ear: External ear normal.     Left Ear: External ear normal.     Mouth/Throat:     Mouth: Mucous membranes are moist.  Eyes:     General: No scleral icterus. Cardiovascular:     Rate and Rhythm: Normal rate and regular rhythm.     Pulses: Normal pulses.     Heart sounds: Normal heart sounds.  Pulmonary:     Effort: Pulmonary effort is normal. No respiratory distress.     Breath sounds: Normal breath sounds.  Abdominal:     General: Abdomen is flat.     Palpations: Abdomen is soft.     Tenderness: There is no abdominal tenderness.  Musculoskeletal:        General: Normal range of motion.     Cervical back: Normal range of motion.     Right lower leg: No edema.     Left lower leg: No edema.  Skin:    General: Skin is warm and dry.     Capillary Refill: Capillary refill takes less than 2 seconds.          Comments: Upper extremities are neurovascular intact bilateral, +2 radial pulses bilateral, strength equal bilateral  Neurological:     Mental Status: He is alert and oriented to person, place, and time.  Psychiatric:        Mood and Affect: Mood normal.        Behavior: Behavior normal.    ED Results / Procedures / Treatments   Labs (all labs ordered are listed, but only abnormal results are displayed) Labs Reviewed  BASIC METABOLIC PANEL - Abnormal; Notable for the following components:      Result Value   Glucose, Bld 122 (*)    BUN 34 (*)    All other components within normal limits  CBC - Abnormal; Notable for the following components:   WBC 21.2 (*)    RBC 4.05 (*)    All other components within normal limits  D-DIMER, QUANTITATIVE - Abnormal; Notable for the following components:   D-Dimer, Quant 0.57 (*)    All other components within normal limits  I-STAT VENOUS BLOOD GAS, ED - Abnormal; Notable for the following components:   Bicarbonate 30.1 (*)    Acid-Base Excess  4.0 (*)    HCT 36.0 (*)    Hemoglobin 12.2 (*)    All other components within normal limits  TROPONIN I (HIGH SENSITIVITY) - Abnormal; Notable for the following components:   Troponin I (High Sensitivity) 22 (*)    All other components within normal limits  TROPONIN I (HIGH SENSITIVITY) - Abnormal; Notable for the following components:   Troponin I (High Sensitivity) 22 (*)    All other components within normal limits  TSH    EKG EKG  Interpretation  Date/Time:  Sunday Sep 05 2021 12:45:26 EDT Ventricular Rate:  108 PR Interval:  199 QRS Duration: 85 QT Interval:  327 QTC Calculation: 439 R Axis:   17 Text Interpretation: Sinus tachycardia Confirmed by Wynona Dove ([id number]) on 09/05/2021 1:18:09 PM  Radiology DG Chest 2 View  Result Date: 09/05/2021 CLINICAL DATA:  Right arm pain.  Intermittent shortness of breath. EXAM: CHEST - 2 VIEW COMPARISON:  Chest two views 11/23/2020; AP chest 06/12/2021 FINDINGS: Cardiac silhouette is again mildly enlarged. Mediastinal contours within normal limits with moderate calcification again seen within aortic arch. Normal variant azygous fissure. Mild bilateral chronic interstitial thickening, greatest within the lung bases is similar to prior. Additional mild left basilar heterogeneous airspace opacity. No definite pleural effusion. No pneumothorax. Mild multilevel degenerative disc changes of the thoracic spine. IMPRESSION: 1. Mild chronic bilateral interstitial thickening. 2. Increased left basilar mild heterogeneous airspace opacification may represent atelectasis versus pneumonia. Electronically Signed   By: Yvonne Kendall M.D.   On: 09/05/2021 13:15   DG Forearm Right  Result Date: 09/05/2021 CLINICAL DATA:  Right arm pain.  No reported injury. EXAM: RIGHT FOREARM - 2 VIEW COMPARISON:  None Available. FINDINGS: No fracture or bone lesion.  Skeletal structures are demineralized. Elbow and wrist joints are normally aligned. Soft tissues are  unremarkable. IMPRESSION: Negative. Electronically Signed   By: Lajean Manes M.D.   On: 09/05/2021 14:17    Procedures .Critical Care Performed by: Jeanell Sparrow, DO Authorized by: Jeanell Sparrow, DO   Critical care provider statement:    Critical care time (minutes):  49   Critical care time was exclusive of:  Separately billable procedures and treating other patients   Critical care was necessary to treat or prevent imminent or life-threatening deterioration of the following conditions:  Respiratory failure   Critical care was time spent personally by me on the following activities:  Development of treatment plan with patient or surrogate, discussions with consultants, evaluation of patient's response to treatment, examination of patient, ordering and review of laboratory studies, ordering and review of radiographic studies, ordering and performing treatments and interventions, pulse oximetry, re-evaluation of patient's condition, review of old charts and obtaining history from patient or surrogate    Medications Ordered in ED Medications  cefTRIAXone (ROCEPHIN) 1 g in sodium chloride 0.9 % 100 mL IVPB (has no administration in time range)  azithromycin (ZITHROMAX) 500 mg in sodium chloride 0.9 % 250 mL IVPB (500 mg Intravenous New Bag/Given 09/05/21 1555)  ketorolac (TORADOL) 15 MG/ML injection 15 mg (15 mg Intravenous Given 09/05/21 1352)  HYDROmorphone (DILAUDID) injection 0.5 mg (0.5 mg Intravenous Given 09/05/21 1352)  ondansetron (ZOFRAN) injection 4 mg (4 mg Intravenous Given 09/05/21 1348)  sodium chloride 0.9 % bolus 1,000 mL (0 mLs Intravenous Stopped 09/05/21 1501)  aspirin chewable tablet 324 mg (324 mg Oral Given 09/05/21 1556)  ipratropium-albuterol (DUONEB) 0.5-2.5 (3) MG/3ML nebulizer solution 3 mL (3 mLs Nebulization Given 09/05/21 1531)    ED Course/ Medical Decision Making/ A&P Clinical Course as of 09/05/21 1558  Sun Sep 05, 2021  1536 82 is PORT score  [SG]     Clinical Course User Index [SG] Jeanell Sparrow, DO                           Medical Decision Making Amount and/or Complexity of Data Reviewed Labs: ordered. Radiology: ordered.  Risk OTC drugs. Prescription drug management.  CC: cp, arm pain, fatigue  This patient presents to the Emergency Department for the above complaint. This involves an extensive number of treatment options and is a complaint that carries with it a high risk of complications and morbidity. Vital signs were reviewed. Serious etiologies considered.  Differential includes all life-threatening causes for chest pain. This includes but is not exclusive to acute coronary syndrome, aortic dissection, pulmonary embolism, cardiac tamponade, community-acquired pneumonia, pericarditis, musculoskeletal chest wall pain, etc.  Record review:  Previous records obtained and reviewed  Care everywhere, prior labs and imaging, home medications  Additional history obtained from spouse  Medical and surgical history as noted above.   Work up as above, notable for:  Labs & imaging results that were available during my care of the patient were visualized by me and considered in my medical decision making.   Low risk Wells score, D-dimer is elevated.  Age-adjusted D-dimer is negative however given patient's persistent tachycardia and dyspnea do have concern for PE, we will proceed with CT angio.  Trop 22, repeat was flat; ECG without acute ischemic changes; he has chest pain at the site of his shingles rash; favor demand ischemia in setting of hypoxia; give ASA  I ordered imaging studies which included chest x-ray, right form x-ray, CT angio chest. I visualized the imaging, interpreted images, and I agree with radiologist interpretation. CXR c/w PNA, CT pending.   CBC with leukocytosis at 21.2. He has been taking steroids for his shingles. This could represent de-margination but in setting of possible PNA and hypoxia concern  there is an underlying infectious process here  Cardiac monitoring reviewed and interpreted personally which shows sinus tachycardia  Management: Give analgesics, IV fluids  Reassessment:  Pt feeling better after intervention.   Admission was considered.   He is pending CT and re-assessment. If he is able to be weaned from supplemental oxygen would be reasonable to offer discharge given low PSI/PORT score. Care signed out to incoming EDP at this time.              Social determinants of health include -  Social History   Socioeconomic History   Marital status: Married    Spouse name: Not on file   Number of children: Not on file   Years of education: Not on file   Highest education level: Not on file  Occupational History   Not on file  Tobacco Use   Smoking status: Former   Smokeless tobacco: Never  Vaping Use   Vaping Use: Not on file  Substance and Sexual Activity   Alcohol use: Not Currently   Drug use: Never   Sexual activity: Yes  Other Topics Concern   Not on file  Social History Narrative   Not on file   Social Determinants of Health   Financial Resource Strain: Not on file  Food Insecurity: Not on file  Transportation Needs: Not on file  Physical Activity: Not on file  Stress: Not on file  Social Connections: Not on file  Intimate Partner Violence: Not on file      This chart was dictated using voice recognition software.  Despite best efforts to proofread,  errors can occur which can change the documentation meaning.         Final Clinical Impression(s) / ED Diagnoses Final diagnoses:  Community acquired pneumonia, unspecified laterality  Herpes zoster without complication  Elevated troponin  Hypoxia    Rx / DC Orders ED Discharge Orders  None         Jeanell Sparrow, DO 09/05/21 1559

## 2021-09-05 NOTE — ED Notes (Signed)
Pt sitting up in bed eating mac and cheese and drinking  coke , wife at bedside eating nabs , belongings placed in bed shoes and clothes

## 2021-09-05 NOTE — ED Triage Notes (Signed)
Patient reports to the ER for right arm pain. Patient denies recent injury. Endorses intermittent shortness of breath. Reports recently had shingles

## 2021-09-05 NOTE — H&P (Signed)
History and Physical    Patient: Justin Conway XKG:[id number] DOB: 11-26-1940 DOA: 09/05/2021 DOS: the patient was seen and examined on 09/05/2021 PCP: Pcp, No  Patient coming from: Home  Chief Complaint:  Chief Complaint  Patient presents with   Arm Pain   HPI: Justin Conway is a 81 y.o. male with medical history significant of HTN, bladder and colon cancers in remission.  Pt had shingles onset 2-3 weeks ago.  Located on R shoulder and back.  Persistent neuropathic pain in that area and down his arm that worsened over the past couple of days.  Still taking prednisone and gabapentin '300mg'$  TID as well as norco and started on steroid taper.  Normally takes chronic steroids (prednisone '20mg'$  daily) for 'fibromyalgia' he says.  But on 5/27 started on medrol taper for post-herpetic neuralgia.  Pt also has increased fatigue and SOB recently, though denies cough, fever, chills, N/V.   Review of Systems: As mentioned in the history of present illness. All other systems reviewed and are negative. Past Medical History:  Diagnosis Date   Bladder cancer ([place])    Colon cancer ([place])    Hypertension    Past Surgical History:  Procedure Laterality Date   BLADDER SURGERY     CHOLECYSTECTOMY     COLON SURGERY     HERNIA REPAIR     Social History:  reports that he has quit smoking. He has never used smokeless tobacco. He reports that he does not currently use alcohol. He reports that he does not use drugs.  Allergies  Allergen Reactions   Fluticasone Other (See Comments)    Blisters    Colestipol Itching    Other reaction(s): Fatigue   Ezetimibe Other (See Comments)    Cramping, 29 Apr 2010     Morphine And Related Other (See Comments)    hallucinfations   Penicillins    Statins Itching    Muscle cramping, 29 Apr 2010    Tramadol Itching    No family history on file.  Prior to Admission medications   Medication Sig Start Date End Date Taking? Authorizing Provider   alendronate (FOSAMAX) 70 MG tablet Take 70 mg by mouth once a week. Saturday 06/07/21  Yes [provider]  amLODipine (NORVASC) 10 MG tablet Take 10 mg by mouth daily.   Yes [provider]  Calcium Carbonate-Vitamin D (OYSTER SHELL CALCIUM/D) 500-5 MG-MCG TABS Take 1 tablet by mouth daily.   Yes [provider]  CALCIUM PO Take 1 tablet by mouth daily.   Yes [provider]  cyanocobalamin 1000 MCG tablet Take 5,000 mcg by mouth daily.   Yes [provider]  diphenhydrAMINE (BENADRYL) 25 MG tablet Take 25 mg by mouth daily as needed for allergies.   Yes [provider]  diphenoxylate-atropine (LOMOTIL) 2.5-0.025 MG tablet Take 1 tablet by mouth 4 (four) times daily as needed for diarrhea or loose stools. 08/09/21  Yes [provider]  DULoxetine (CYMBALTA) 20 MG capsule Take 20 mg by mouth daily.   Yes [provider]  ferrous sulfate 325 (65 FE) MG tablet Take 325 mg by mouth daily with breakfast.   Yes [provider]  gabapentin (NEURONTIN) 300 MG capsule Take 300 mg by mouth 3 (three) times daily. 08/16/21  Yes [provider]  hydrALAZINE (APRESOLINE) 25 MG tablet Take 25 mg by mouth daily. 09/25/13  Yes [provider]  HYDROcodone-acetaminophen (NORCO) 7.5-325 MG tablet Take 0.5 tablets by mouth 4 (four)  times daily as needed for moderate pain. 09/03/21  Yes [provider]  Lactobacillus Rhamnosus, GG, (CULTURELLE) CAPS Take 1 capsule by mouth daily.   Yes [provider]  lidocaine (LMX) 4 % cream Apply 1 application. topically 5 (five) times daily. 08/16/21  Yes [provider]  methylPREDNISolone (MEDROL) 8 MG tablet Take 4-48 mg by mouth See admin instructions. Take 6 tablets by mouth  once a day for 3 days, then take 5 tablets once a day for 3 days, then take 4 tablets once a day for 3 days, then take 3 tablets once a day for 3 days, then take 2 tablets by mouth for 3  days, then take 1 tablet once a day for three days 09/03/21  Yes [provider]  omeprazole (PRILOSEC) 40 MG capsule Take 40 mg by mouth daily.   Yes [provider]  rOPINIRole (REQUIP) 0.5 MG tablet Take 0.5 mg by mouth at bedtime. 07/27/21  Yes [provider]  predniSONE (DELTASONE) 5 MG tablet Take 20 mg by mouth daily. Patient not taking: Reported on 09/05/2021 08/27/21   [provider]    Physical Exam: Vitals:   09/05/21 1630 09/05/21 1900 09/05/21 1930 09/05/21 2138  BP: (!) 153/89 133/73 (!) 144/73 (!) 165/90  Pulse: (!) 106 98 95 93  Resp: '16 11 16 16  '$ Temp:   98.2 F (36.8 C) 98.3 F (36.8 C)  TempSrc:   Oral Oral  SpO2: 90% 93% 93% 96%  Weight:      Height:       Constitutional: NAD, calm, comfortable Eyes: PERRL, lids and conjunctivae normal ENMT: Mucous membranes are moist. Posterior pharynx clear of any exudate or lesions.Normal dentition.  Neck: normal, supple, no masses, no thyromegaly Respiratory: Diminished L base Cardiovascular: Regular rate and rhythm, no murmurs / rubs / gallops. No extremity edema. 2+ pedal pulses. No carotid bruits.  Abdomen: no tenderness, no masses palpated. No hepatosplenomegaly. Bowel sounds positive.  Musculoskeletal: no clubbing / cyanosis. No joint deformity upper and lower extremities. Good ROM, no contractures. Normal muscle tone.  Skin: healed shingles lesions on R shoulder and back. Neurologic: CN 2-12 grossly intact. Sensation intact, DTR normal. Strength 5/5 in all 4.  Psychiatric: Normal judgment and insight. Alert and oriented x 3. Normal mood.   Data Reviewed:    CBC    Component Value Date/Time   WBC 21.2 (H) 09/05/2021 1304   RBC 4.05 (L) 09/05/2021 1304   HGB 12.2 (L) 09/05/2021 1542   HCT 36.0 (L) 09/05/2021 1542   PLT 193 09/05/2021 1304   MCV 97.5 09/05/2021 1304   MCH 32.6 09/05/2021 1304   MCHC 33.4 09/05/2021 1304   RDW 14.4 09/05/2021 1304   LYMPHSABS 1.3 11/28/2017  1814   MONOABS 1.1 (H) 11/28/2017 1814   EOSABS 0.3 11/28/2017 1814   BASOSABS 0.0 11/28/2017 1814   CTA chest: IMPRESSION: 1. No pulmonary emboli. 2. Dense left lower lobe pneumonia or atelectasis at the posterior left lung base. 3. Additional mild bilateral dependent atelectasis. 4.  Calcific coronary artery and aortic atherosclerosis. 5. 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 6. Mild cardiomegaly.  Assessment and Plan: * CAP (community acquired pneumonia) LLL PNA with hypoxia. CAP pathway Rocephin + azithro Urine s.pneumo and legionella Trend WBC  Acute respiratory failure with hypoxia ([place]) Patient has acute respiratory failure with hypoxia due to having a new oxygen requirement.  That is the patient has a PaO2 < 60 (pulse  Ox < 90%) on room air. Short of breath, requiring O2 via [place], only satting 87% on RA.  Current chronic use of systemic steroids Chronically uses '20mg'$  prednisone daily for "fibromyalgia" apparently. Currently on hold as he is on burst taper of medrol for post-herpetic neuralgia. Continue burst taper of medrol Followed by resumption of chronic steroids.  Herpes zoster Shingles of chest. Ongoing for 2-3 weeks now. Lesions appear healed. Not really any benefit to antiviral therapy at this late a date (>72h since onset, healing/healed lesions). Pain control for post herpetic neuralgia: Currently on gabapentin '300mg'$  TID Increase as needed by 300 mg every three days up to 600 mg three times daily Will start 300/300/600 now. Cautious use of opiates if needed: though h/o hallucinations to morphine, so prefer to avoid if possible. Cont home norco PRN Continue medrol burst taper      Advance Care Planning:   Code Status: Full Code  Consults: None  Family Communication: No family in room  Severity of Illness: The appropriate patient status for this patient is OBSERVATION. Observation status is judged to be  reasonable and necessary in order to provide the required intensity of service to ensure the patient's safety. The patient's presenting symptoms, physical exam findings, and initial radiographic and laboratory data in the context of their medical condition is felt to place them at decreased risk for further clinical deterioration. Furthermore, it is anticipated that the patient will be medically stable for discharge from the hospital within 2 midnights of admission.   Author: Etta Quill., DO 09/05/2021 10:53 PM  For on call review [email or web address].

## 2021-09-05 NOTE — ED Notes (Signed)
Patient in Xray

## 2021-09-05 NOTE — Assessment & Plan Note (Addendum)
Patient has acute respiratory failure with hypoxia due to having a new oxygen requirement.  That is the patient has a PaO2 < 60 (pulse Ox < 90%) on room air. Short of breath, requiring O2 via [place], only satting 87% on RA.

## 2021-09-06 DIAGNOSIS — J189 Pneumonia, unspecified organism: Secondary | ICD-10-CM | POA: Diagnosis not present

## 2021-09-06 DIAGNOSIS — B0229 Other postherpetic nervous system involvement: Secondary | ICD-10-CM

## 2021-09-06 LAB — STREP PNEUMONIAE URINARY ANTIGEN: Strep Pneumo Urinary Antigen: NEGATIVE

## 2021-09-06 LAB — CBC
HCT: 35.3 % — ABNORMAL LOW (ref 39.0–52.0)
Hemoglobin: 11.4 g/dL — ABNORMAL LOW (ref 13.0–17.0)
MCH: 32.4 pg (ref 26.0–34.0)
MCHC: 32.3 g/dL (ref 30.0–36.0)
MCV: 100.3 fL — ABNORMAL HIGH (ref 80.0–100.0)
Platelets: 158 10*3/uL (ref 150–400)
RBC: 3.52 MIL/uL — ABNORMAL LOW (ref 4.22–5.81)
RDW: 14.3 % (ref 11.5–15.5)
WBC: 13.8 10*3/uL — ABNORMAL HIGH (ref 4.0–10.5)
nRBC: 0 % (ref 0.0–0.2)

## 2021-09-06 LAB — BASIC METABOLIC PANEL
Anion gap: 6 (ref 5–15)
BUN: 36 mg/dL — ABNORMAL HIGH (ref 8–23)
CO2: 28 mmol/L (ref 22–32)
Calcium: 9 mg/dL (ref 8.9–10.3)
Chloride: 107 mmol/L (ref 98–111)
Creatinine, Ser: 1.06 mg/dL (ref 0.61–1.24)
GFR, Estimated: >60 mL/min (ref >60)
Glucose, Bld: 104 mg/dL — ABNORMAL HIGH (ref 70–99)
Potassium: 3.9 mmol/L (ref 3.5–5.1)
Sodium: 141 mmol/L (ref 135–145)

## 2021-09-06 LAB — HIV ANTIBODY (ROUTINE TESTING W REFLEX): HIV Screen 4th Generation wRfx: NONREACTIVE

## 2021-09-06 MED ORDER — AZITHROMYCIN 500 MG PO TABS: 500.0000 mg | ORAL_TABLET | Freq: Every day | ORAL | 0 refills | Status: AC

## 2021-09-06 MED ORDER — LABETALOL HCL 5 MG/ML IV SOLN
20.0000 mg | INTRAVENOUS | Status: DC | PRN
  Administered 2021-09-06: 20 mg via INTRAVENOUS
  Filled 2021-09-06: qty 4

## 2021-09-06 MED ORDER — CEPHALEXIN 500 MG PO CAPS: 500.0000 mg | ORAL_CAPSULE | Freq: Four times a day (QID) | ORAL | 0 refills | Status: AC

## 2021-09-06 NOTE — Progress Notes (Signed)
Care resumed for this patient around 1000. Agree with previous assessment.

## 2021-09-06 NOTE — TOC CM/SW Note (Signed)
  Transition of Care [facility]) Screening Note   Patient Details  Name: Justin Conway Date of Birth: 1940/10/03   Transition of Care [facility]) CM/SW Contact:    Ross Ludwig, LCSW Phone Number: 09/06/2021, 11:19 AM    Transition of Care Department [facility]) has reviewed patient and no TOC needs have been identified at this time. We will continue to monitor patient advancement through interdisciplinary progression rounds. If new patient transition needs arise, please place a TOC consult.

## 2021-09-06 NOTE — Discharge Summary (Signed)
Physician Discharge Summary  Justin Conway ENI:[id number] DOB: 1940-12-29 DOA: 09/05/2021  PCP: Robert Bellow, PA-C  Admit date: 09/05/2021 Discharge date: 09/06/2021  Admitted From: Home Disposition:  Home  Recommendations for Outpatient Follow-up:  Follow up with PCP   Discharge Condition: Stable CODE STATUS: Full  Diet recommendation:  Diet Orders (From admission, onward)     Start     Ordered   09/06/21 0000  Diet - low sodium heart healthy        09/06/21 1012   09/05/21 2215  Diet Heart Room service appropriate? Yes; Fluid consistency: Thin  Diet effective now       Question Answer Comment  Room service appropriate? Yes   Fluid consistency: Thin      09/05/21 2216           Brief/Interim Summary: From H&P by Dr. Alcario Drought: "Justin Conway is a 81 y.o. male with medical history significant of HTN, bladder and colon cancers in remission.   Pt had shingles onset 2-3 weeks ago.  Located on R shoulder and back.  Persistent neuropathic pain in that area and down his arm that worsened over the past couple of days.   Still taking prednisone and gabapentin '300mg'$  TID as well as norco and started on steroid taper.   Normally takes chronic steroids (prednisone '20mg'$  daily) for 'fibromyalgia' he says.  But on 5/27 started on medrol taper for post-herpetic neuralgia.   Pt also has increased fatigue and SOB recently, though denies cough, fever, chills, N/V."   Patient was started on Rocephin and azithromycin for community-acquired pneumonia.  He was also started on gabapentin for postherpetic neuralgia.  On day of discharge, patient was feeling well, pain over site of shingles improved on gabapentin.  No complaints of shortness of breath.  Weaned to room air.  Discharge Diagnoses:   Principal Problem:   CAP (community acquired pneumonia) Active Problems:   Acute respiratory failure with hypoxia (HCC)   Herpes zoster   Current chronic use of systemic steroids   Post herpetic  neuralgia    Discharge Instructions  Discharge Instructions     Call MD for:  difficulty breathing, headache or visual disturbances   Complete by: As directed    Call MD for:  extreme fatigue   Complete by: As directed    Call MD for:  persistant dizziness or light-headedness   Complete by: As directed    Call MD for:  persistant nausea and vomiting   Complete by: As directed    Call MD for:  severe uncontrolled pain   Complete by: As directed    Call MD for:  temperature >100.4   Complete by: As directed    Diet - low sodium heart healthy   Complete by: As directed    Discharge instructions   Complete by: As directed    You were cared for by a hospitalist during your hospital stay. If you have any questions about your discharge medications or the care you received while you were in the hospital after you are discharged, you can call the unit and ask to speak with the hospitalist on call if the hospitalist that took care of you is not available. Once you are discharged, your primary care physician will handle any further medical issues. Please note that NO REFILLS for any discharge medications will be authorized once you are discharged, as it is imperative that you return to your primary care physician (or establish a relationship with a  primary care physician if you do not have one) for your aftercare needs so that they can reassess your need for medications and monitor your lab values.   Increase activity slowly   Complete by: As directed       Allergies as of 09/06/2021       Reactions   Fluticasone Other (See Comments)   Blisters   Colestipol Itching   Other reaction(s): Fatigue   Ezetimibe Other (See Comments)   Cramping, 29 Apr 2010   Morphine And Related Other (See Comments)   hallucinfations   Penicillins    Statins Itching   Muscle cramping, 29 Apr 2010   Tramadol Itching        Medication List     TAKE these medications    alendronate 70 MG  tablet Commonly known as: FOSAMAX Take 70 mg by mouth once a week. Saturday   amLODipine 10 MG tablet Commonly known as: NORVASC Take 10 mg by mouth daily.   azithromycin 500 MG tablet Commonly known as: Zithromax Take 1 tablet (500 mg total) by mouth daily for 4 days.   CALCIUM PO Take 1 tablet by mouth daily.   cephALEXin 500 MG capsule Commonly known as: KEFLEX Take 1 capsule (500 mg total) by mouth 4 (four) times daily for 4 days.   Culturelle Caps Take 1 capsule by mouth daily.   cyanocobalamin 1000 MCG tablet Take 5,000 mcg by mouth daily.   diphenhydrAMINE 25 MG tablet Commonly known as: BENADRYL Take 25 mg by mouth daily as needed for allergies.   diphenoxylate-atropine 2.5-0.025 MG tablet Commonly known as: LOMOTIL Take 1 tablet by mouth 4 (four) times daily as needed for diarrhea or loose stools.   DULoxetine 20 MG capsule Commonly known as: CYMBALTA Take 20 mg by mouth daily.   ferrous sulfate 325 (65 FE) MG tablet Take 325 mg by mouth daily with breakfast.   gabapentin 300 MG capsule Commonly known as: NEURONTIN Take 300 mg by mouth 3 (three) times daily.   hydrALAZINE 25 MG tablet Commonly known as: APRESOLINE Take 25 mg by mouth daily.   HYDROcodone-acetaminophen 7.5-325 MG tablet Commonly known as: NORCO Take 0.5 tablets by mouth 4 (four) times daily as needed for moderate pain.   lidocaine 4 % cream Commonly known as: LMX Apply 1 application. topically 5 (five) times daily.   methylPREDNISolone 8 MG tablet Commonly known as: MEDROL Take 4-48 mg by mouth See admin instructions. Take 6 tablets by mouth  once a day for 3 days, then take 5 tablets once a day for 3 days, then take 4 tablets once a day for 3 days, then take 3 tablets once a day for 3 days, then take 2 tablets by mouth for 3 days, then take 1 tablet once a day for three days   omeprazole 40 MG capsule Commonly known as: PRILOSEC Take 40 mg by mouth daily.   Oyster Shell  Calcium/D 500-5 MG-MCG Tabs Take 1 tablet by mouth daily.   predniSONE 5 MG tablet Commonly known as: DELTASONE Take 20 mg by mouth daily.   rOPINIRole 0.5 MG tablet Commonly known as: REQUIP Take 0.5 mg by mouth at bedtime.        Follow-up Information     Robert Bellow, PA-C Follow up.   Specialty: Internal Medicine Contact information: [address] [place]  [address] [phone]                Allergies  Allergen  Reactions   Fluticasone Other (See Comments)    Blisters    Colestipol Itching    Other reaction(s): Fatigue   Ezetimibe Other (See Comments)    Cramping, 29 Apr 2010     Morphine And Related Other (See Comments)    hallucinfations   Penicillins    Statins Itching    Muscle cramping, 29 Apr 2010    Tramadol Itching      Procedures/Studies: DG Chest 2 View  Result Date: 09/05/2021 CLINICAL DATA:  Right arm pain.  Intermittent shortness of breath. EXAM: CHEST - 2 VIEW COMPARISON:  Chest two views 11/23/2020; AP chest 06/12/2021 FINDINGS: Cardiac silhouette is again mildly enlarged. Mediastinal contours within normal limits with moderate calcification again seen within aortic arch. Normal variant azygous fissure. Mild bilateral chronic interstitial thickening, greatest within the lung bases is similar to prior. Additional mild left basilar heterogeneous airspace opacity. No definite pleural effusion. No pneumothorax. Mild multilevel degenerative disc changes of the thoracic spine. IMPRESSION: 1. Mild chronic bilateral interstitial thickening. 2. Increased left basilar mild heterogeneous airspace opacification may represent atelectasis versus pneumonia. Electronically Signed   By: Yvonne Kendall M.D.   On: 09/05/2021 13:15   DG Forearm Right  Result Date: 09/05/2021 CLINICAL DATA:  Right arm pain.  No reported injury. EXAM: RIGHT FOREARM - 2 VIEW COMPARISON:  None Available. FINDINGS: No fracture or bone lesion.  Skeletal  structures are demineralized. Elbow and wrist joints are normally aligned. Soft tissues are unremarkable. IMPRESSION: Negative. Electronically Signed   By: Lajean Manes M.D.   On: 09/05/2021 14:17   CT Angio Chest PE W and/or Wo Contrast  Result Date: 09/05/2021 CLINICAL DATA:  Shortness of breath, right arm pain and elevated D-dimer. EXAM: CT ANGIOGRAPHY CHEST WITH CONTRAST TECHNIQUE: Multidetector CT imaging of the chest was performed using the standard protocol during bolus administration of intravenous contrast. Multiplanar CT image reconstructions and MIPs were obtained to evaluate the vascular anatomy. RADIATION DOSE REDUCTION: This exam was performed according to the departmental dose-optimization program which includes automated exposure control, adjustment of the mA and/or kV according to patient size and/or use of iterative reconstruction technique. CONTRAST:  68m OMNIPAQUE IOHEXOL 350 MG/ML SOLN COMPARISON:  Chest radiographs obtained earlier today. FINDINGS: Cardiovascular: Normally opacified pulmonary arteries with no pulmonary arterial filling defects seen. Mildly enlarged heart. Atheromatous calcifications, including the coronary arteries and aorta. Mediastinum/Nodes: 1.0 cm left lobe thyroid nodule on image number 13/4. Two smaller right lobe nodules. No follow-up imaging is recommended. Reference: J Am Coll Radiol. 2015 Feb;12(2): 143-50. No enlarged lymph nodes. Unremarkable esophagus. Lungs/Pleura: Diffuse bilateral bullous changes in a centrilobular distribution. Lesser degree of paraseptal bullous changes. Mild bilateral dependent atelectasis. Dense consolidation at the inferior left lung base medially. Some associated air bronchograms. Upper Abdomen: Cholecystectomy clips. Musculoskeletal: Thoracic and lower cervical spine degenerative changes. Review of the MIP images confirms the above findings. IMPRESSION: 1. No pulmonary emboli. 2. Dense left lower lobe pneumonia or atelectasis at the  posterior left lung base. 3. Additional mild bilateral dependent atelectasis. 4.  Calcific coronary artery and aortic atherosclerosis. 5. 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 6. Mild cardiomegaly. Aortic Atherosclerosis (ICD10-I70.0) and Emphysema (ICD10-J43.9). Electronically Signed   By: SClaudie ReveringM.D.   On: 09/05/2021 16:31       Discharge Exam: Vitals:   09/06/21 0645 09/06/21 0937  BP: (!) 181/100 (!) 148/70  Pulse: 89 72  Resp:  20  Temp:  98.5 F (36.9 C)  SpO2:  91%    General: Pt is alert, awake, not in acute distress Cardiovascular: RRR, S1/S2 +, no edema Respiratory: CTA bilaterally, no wheezing, no rhonchi, no respiratory distress, no conversational dyspnea, on room air  Abdominal: Soft, NT, ND, bowel sounds + Extremities: no edema, no cyanosis Skin: scabbed area over right shoulder and arm consistent with healed herpes zoster  Psych: Normal mood and affect, stable judgement and insight     The results of significant diagnostics from this hospitalization (including imaging, microbiology, ancillary and laboratory) are listed below for reference.     Microbiology: No results found for this or any previous visit (from the past 240 hour(s)).   Labs: BNP (last 3 results) No results for input(s): BNP in the last 8760 hours. Basic Metabolic Panel: Recent Labs  Lab 09/05/21 1304 09/05/21 1542 09/06/21 0453  NA 142 140 141  K 3.9 4.4 3.9  CL 101  --  107  CO2 30  --  28  GLUCOSE 122*  --  104*  BUN 34*  --  36*  CREATININE 1.17  --  1.06  CALCIUM 10.2  --  9.0   Liver Function Tests: No results for input(s): AST, ALT, ALKPHOS, BILITOT, PROT, ALBUMIN in the last 168 hours. No results for input(s): LIPASE, AMYLASE in the last 168 hours. No results for input(s): AMMONIA in the last 168 hours. CBC: Recent Labs  Lab 09/05/21 1304 09/05/21 1542 09/06/21 0453  WBC 21.2*  --  13.8*  HGB 13.2 12.2* 11.4*  HCT 39.5  36.0* 35.3*  MCV 97.5  --  100.3*  PLT 193  --  158   Cardiac Enzymes: No results for input(s): CKTOTAL, CKMB, CKMBINDEX, TROPONINI in the last 168 hours. BNP: Invalid input(s): POCBNP CBG: No results for input(s): GLUCAP in the last 168 hours. D-Dimer Recent Labs    09/05/21 1304  DDIMER 0.57*   Hgb A1c No results for input(s): HGBA1C in the last 72 hours. Lipid Profile No results for input(s): CHOL, HDL, LDLCALC, TRIG, CHOLHDL, LDLDIRECT in the last 72 hours. Thyroid function studies Recent Labs    09/05/21 1325  TSH 0.800   Anemia work up No results for input(s): VITAMINB12, FOLATE, FERRITIN, TIBC, IRON, RETICCTPCT in the last 72 hours. Urinalysis    Component Value Date/Time   COLORURINE YELLOW 01/18/2019 1940   APPEARANCEUR CLEAR 01/18/2019 1940   LABSPEC <1.005 (L) 01/18/2019 1940   PHURINE 6.5 01/18/2019 1940   GLUCOSEU NEGATIVE 01/18/2019 1940   HGBUR NEGATIVE 01/18/2019 1940   [place] NEGATIVE 01/18/2019 [place] NEGATIVE 01/18/2019 1940   PROTEINUR NEGATIVE 01/18/2019 1940   NITRITE NEGATIVE 01/18/2019 1940   LEUKOCYTESUR TRACE (A) 01/18/2019 1940   Sepsis Labs Invalid input(s): PROCALCITONIN,  WBC,  LACTICIDVEN Microbiology No results found for this or any previous visit (from the past 240 hour(s)).   Patient was seen and examined on the day of discharge and was found to be in stable condition. Time coordinating discharge: 35 minutes including assessment and coordination of care, as well as examination of the patient.   SIGNED:  Dessa Phi, DO [facility] 09/06/2021, 10:15 AM

## 2021-09-07 LAB — LEGIONELLA PNEUMOPHILA SEROGP 1 UR AG: L. pneumophila Serogp 1 Ur Ag: NEGATIVE

## 2023-07-15 IMAGING — CR DG SHOULDER 2+V*L*
3 series · 3 of 3 positions shown · non-contrast
Comparison: None.

CLINICAL DATA: Acute left shoulder pain.

EXAM:
LEFT SHOULDER - 2+ VIEW

[w shoulder grashey left]
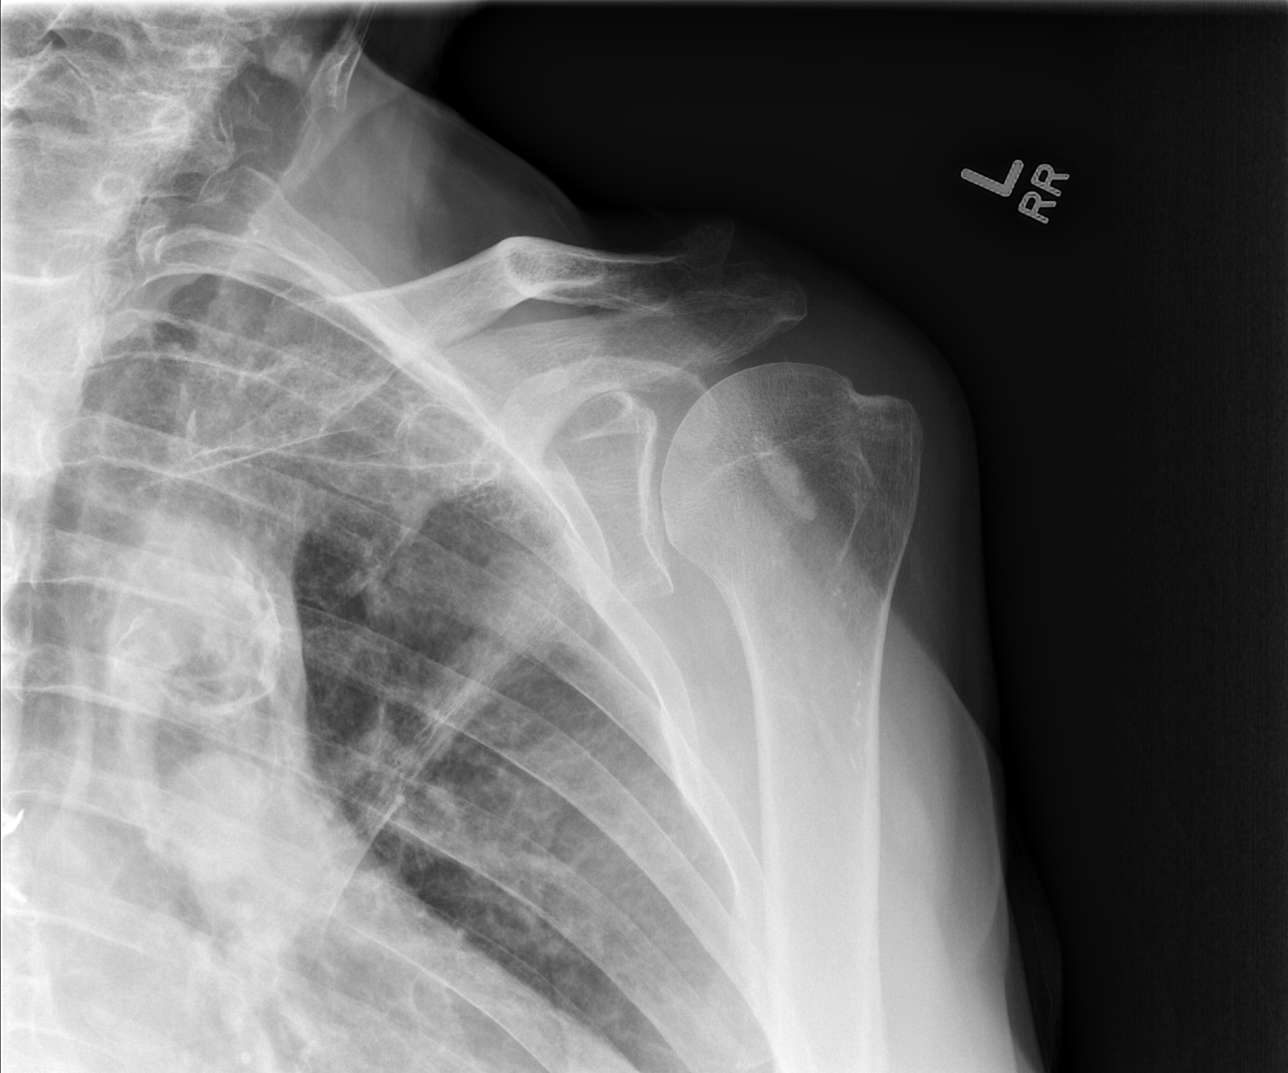

[w shoulder y view left *]
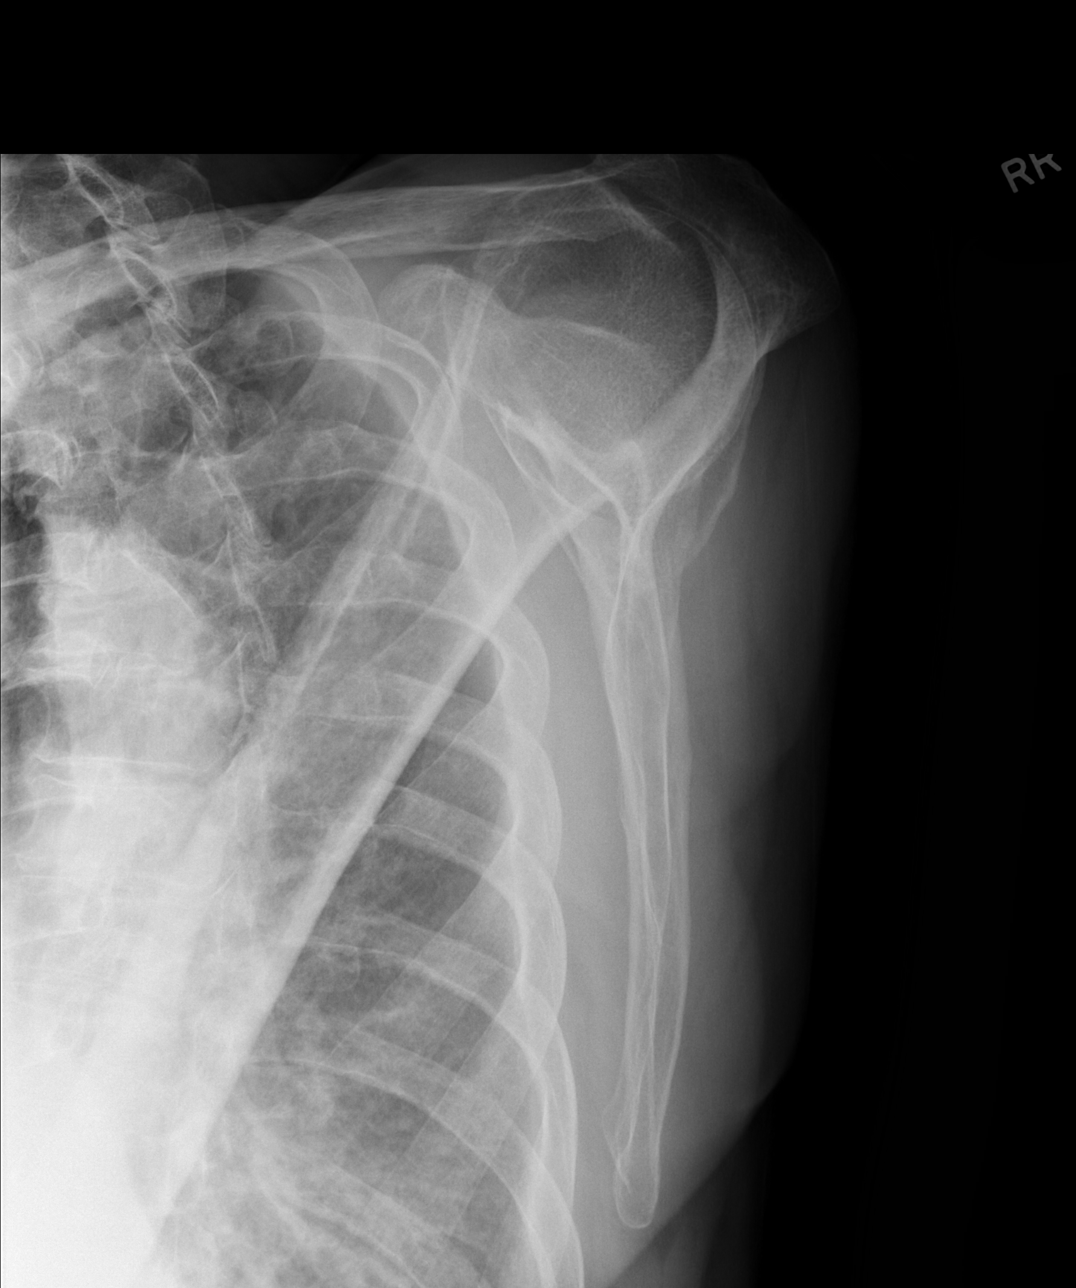

[x shoulder axillary left *]
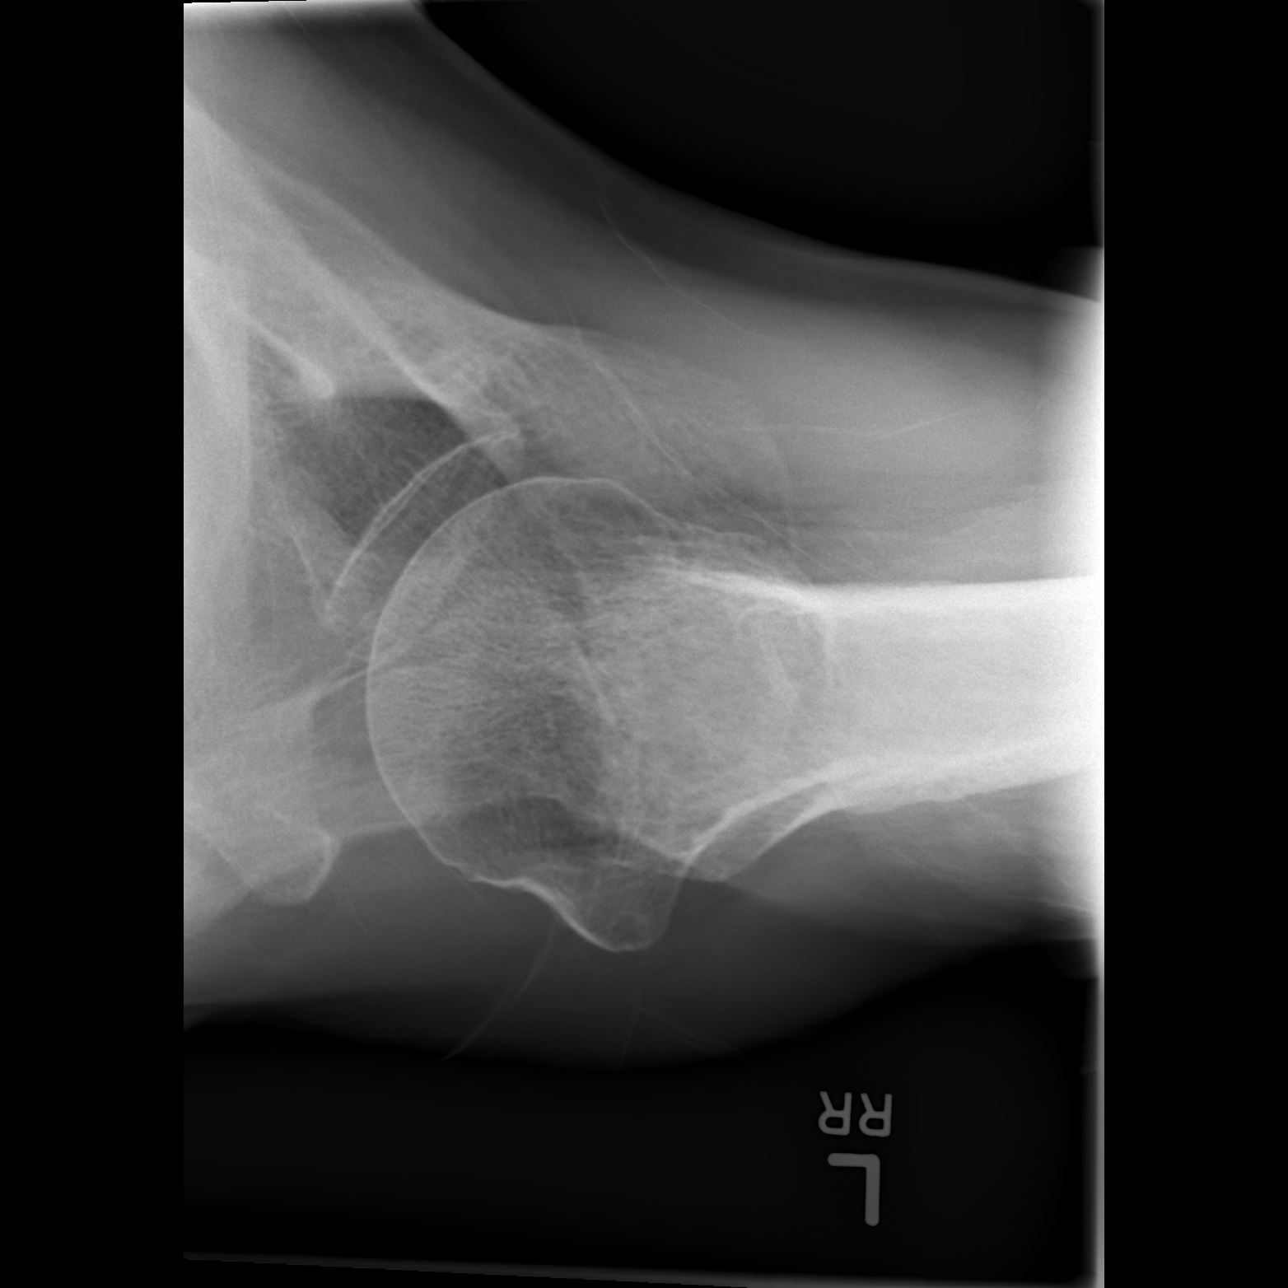

[3 of 3 positions shown; findings below may reference images not displayed]

FINDINGS: There is no evidence of fracture or dislocation. There is no
evidence of arthropathy or other focal bone abnormality. Soft
tissues are unremarkable.
IMPRESSION: Negative.

## 2023-07-15 IMAGING — CR DG SHOULDER 2+V*R*
3 series · 3 of 3 positions shown · non-contrast
Comparison: None.

CLINICAL DATA: Acute right shoulder pain.

EXAM:
RIGHT SHOULDER - 2+ VIEW

[w shoulder grashey right]
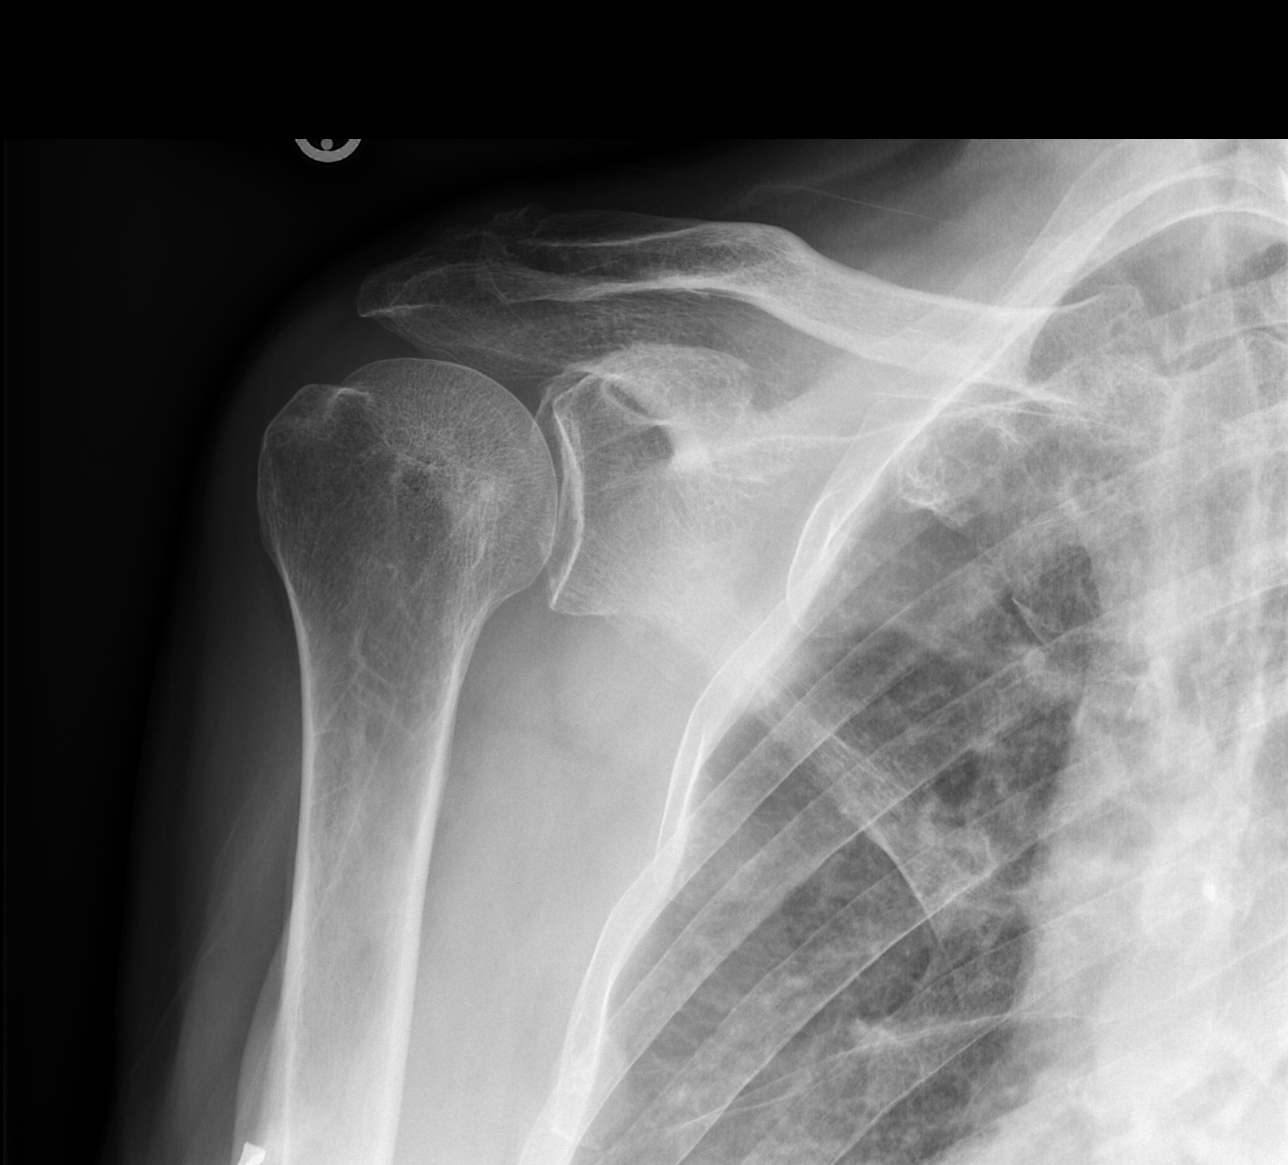

[w shoulder y view right]
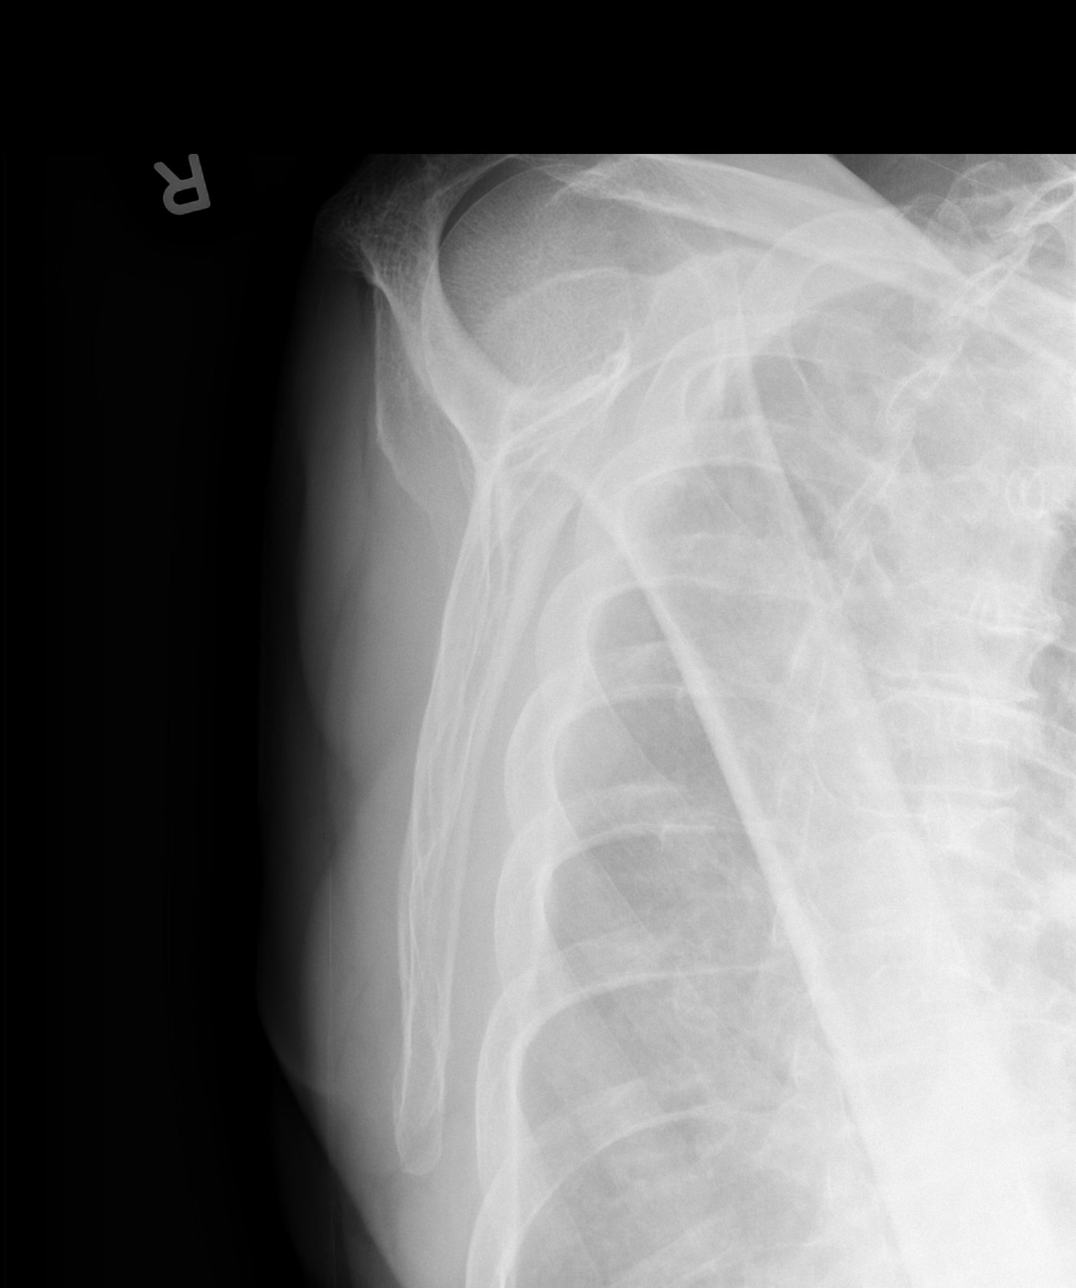

[x shoulder axillary right *]
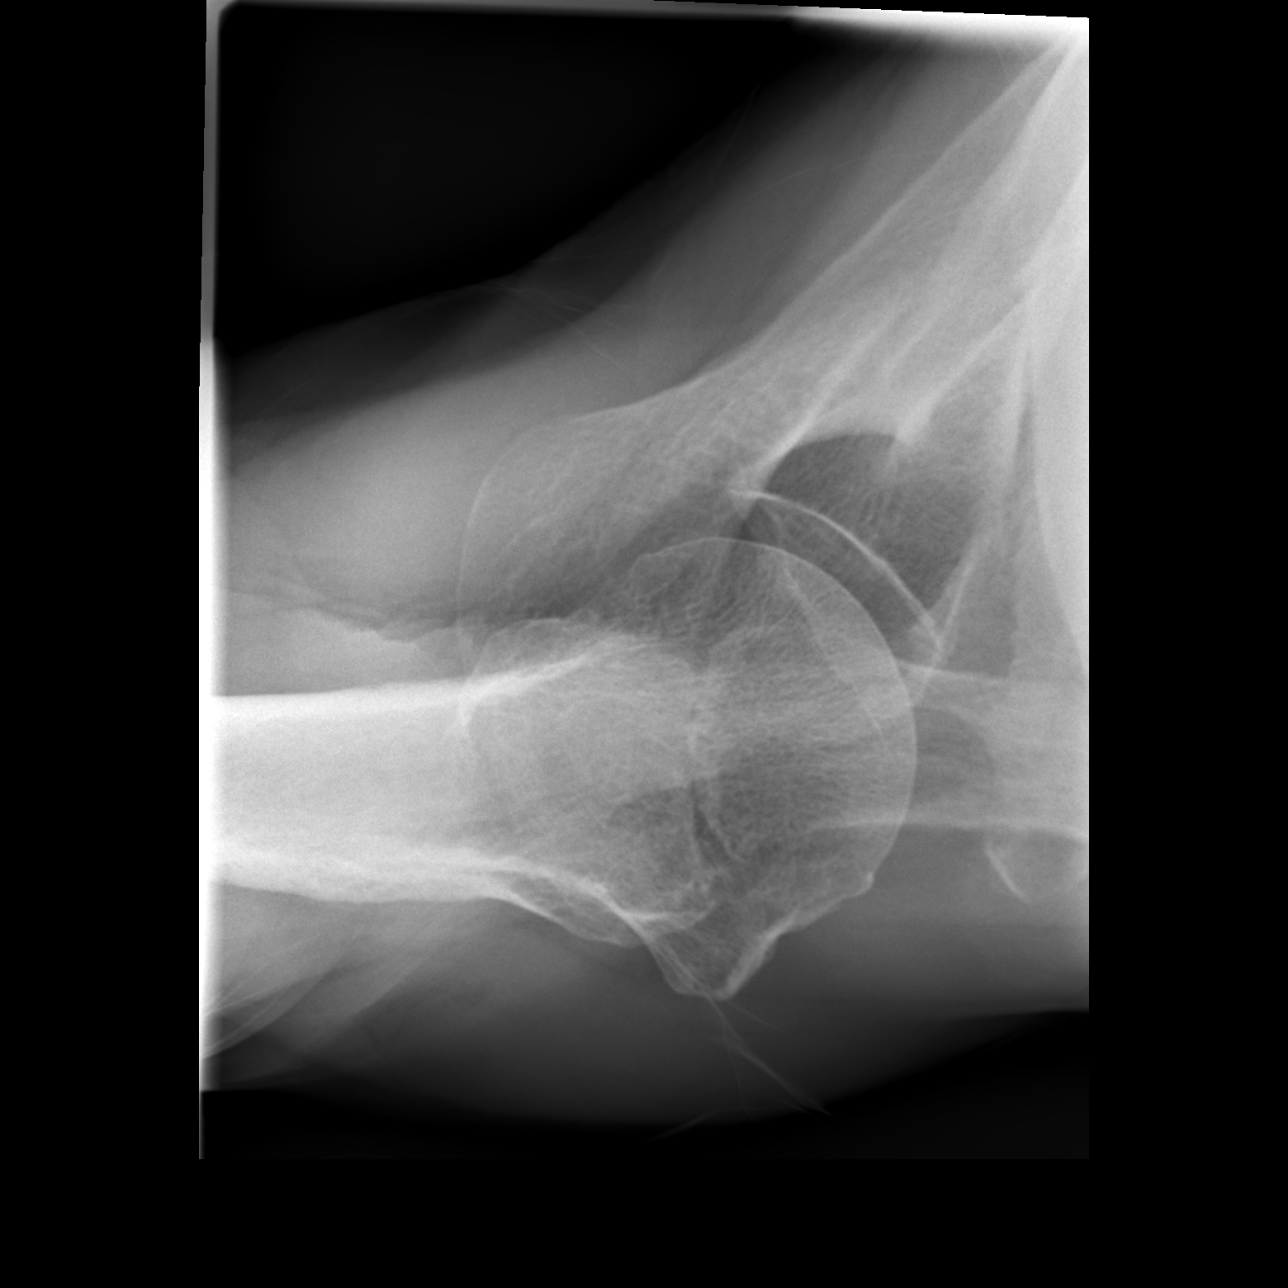

[3 of 3 positions shown; findings below may reference images not displayed]

FINDINGS: There is no evidence of fracture or dislocation. There is no
evidence of arthropathy or other focal bone abnormality. Soft
tissues are unremarkable.
IMPRESSION: Negative.
